# Patient Record
Sex: Male | Born: 1975 | Race: White | Hispanic: No | Marital: Married | Smoking: Never smoker
Health system: Southern US, Community
[De-identification: ages and names within clinical notes are randomized; demographics above are authoritative.]

## PROBLEM LIST (undated history)

## (undated) DIAGNOSIS — T7840XA Allergy, unspecified, initial encounter: Secondary | ICD-10-CM

## (undated) HISTORY — DX: Allergy, unspecified, initial encounter: T78.40XA

---

## 2000-11-08 HISTORY — PX: APPENDECTOMY: SHX54

## 2020-07-07 ENCOUNTER — Other Ambulatory Visit: Payer: Self-pay

## 2020-07-07 ENCOUNTER — Encounter (HOSPITAL_COMMUNITY): Payer: Self-pay | Admitting: *Deleted

## 2020-07-07 ENCOUNTER — Emergency Department (HOSPITAL_COMMUNITY)
Admission: EM | Admit: 2020-07-07 | Discharge: 2020-07-07 | Disposition: A | Discharge status: Home / Self Care | Payer: Managed Care, Other (non HMO) | Attending: Emergency Medicine | Admitting: Emergency Medicine

## 2020-07-07 ENCOUNTER — Emergency Department (HOSPITAL_COMMUNITY): Payer: Managed Care, Other (non HMO)

## 2020-07-07 DIAGNOSIS — J1282 Pneumonia due to coronavirus disease 2019: Secondary | ICD-10-CM | POA: Diagnosis not present

## 2020-07-07 DIAGNOSIS — R0789 Other chest pain: Secondary | ICD-10-CM | POA: Diagnosis present

## 2020-07-07 DIAGNOSIS — U071 COVID-19: Secondary | ICD-10-CM | POA: Diagnosis not present

## 2020-07-07 LAB — CBC
HCT: 43.2 % (ref 39.0–52.0)
Hemoglobin: 14 g/dL (ref 13.0–17.0)
MCH: 30.2 pg (ref 26.0–34.0)
MCHC: 32.4 g/dL (ref 30.0–36.0)
MCV: 93.3 fL (ref 80.0–100.0)
Platelets: 129 10*3/uL — ABNORMAL LOW (ref 150–400)
RBC: 4.63 MIL/uL (ref 4.22–5.81)
RDW: 12.8 % (ref 11.5–15.5)
WBC: 3.7 10*3/uL — ABNORMAL LOW (ref 4.0–10.5)
nRBC: 0 % (ref 0.0–0.2)

## 2020-07-07 LAB — BASIC METABOLIC PANEL
Anion gap: 8 (ref 5–15)
BUN: 16 mg/dL (ref 6–20)
CO2: 22 mmol/L (ref 22–32)
Calcium: 8.1 mg/dL — ABNORMAL LOW (ref 8.9–10.3)
Chloride: 106 mmol/L (ref 98–111)
Creatinine, Ser: 1.09 mg/dL (ref 0.61–1.24)
GFR calc Af Amer: >60 mL/min (ref >60)
GFR calc non Af Amer: >60 mL/min (ref >60)
Glucose, Bld: 144 mg/dL — ABNORMAL HIGH (ref 70–99)
Potassium: 3.6 mmol/L (ref 3.5–5.1)
Sodium: 136 mmol/L (ref 135–145)

## 2020-07-07 LAB — SARS CORONAVIRUS 2 BY RT PCR (HOSPITAL ORDER, PERFORMED IN ~~LOC~~ HOSPITAL LAB): SARS Coronavirus 2: POSITIVE — AB

## 2020-07-07 LAB — TROPONIN I (HIGH SENSITIVITY): Troponin I (High Sensitivity): 4 ng/L (ref <18)

## 2020-07-07 MED ORDER — ALBUTEROL SULFATE HFA 108 (90 BASE) MCG/ACT IN AERS
8.0000 | INHALATION_SPRAY | Freq: Once | RESPIRATORY_TRACT | Status: AC
  Administered 2020-07-07: 8 via RESPIRATORY_TRACT
  Filled 2020-07-07: qty 6.7

## 2020-07-07 MED ORDER — AEROCHAMBER PLUS FLO-VU LARGE MISC
1.0000 | Freq: Once | Status: AC
  Administered 2020-07-07: 1

## 2020-07-07 MED ORDER — ACETAMINOPHEN 325 MG PO TABS
650.0000 mg | ORAL_TABLET | Freq: Once | ORAL | Status: AC | PRN
  Administered 2020-07-07: 650 mg via ORAL
  Filled 2020-07-07: qty 2

## 2020-07-07 NOTE — Discharge Instructions (Addendum)
Thank you for allowing me to care for you today in the Emergency Department.   Your work-up today demonstrated a developing pneumonia in the left lung related to COVID-19.  Since this is a viral pneumonia, antibiotics are not indicated at this time.   Take 650 mg of Tylenol or 600 mg of ibuprofen with food every 6 hours for pain or fever.  You can alternate between these 2 medications every 3 hours if your pain returns.  For instance, you can take Tylenol at noon, followed by a dose of ibuprofen at 3, followed by second dose of Tylenol and 6.  You can use 2 to 4 puffs of albuterol with a spacer every 4-6 hours as needed for chest tightness or shortness of breath.  If you have symptoms, the criteria for ending isolation include:  10 days have passed since your symptoms began; 24 fever-free hours without the use of fever-reducing medications; Your other COVID-19 symptoms are improving  "Some of the other symptoms of COVID-19 may take quite some time to go away. For instance, loss of smell or taste can linger in some people," says Dr. Vear Clock. "You do not need to delay vaccination if you're still experiencing these more mild symptoms of COVID-19."  If you are not experiencing symptoms, there's one main criteria for ending isolation:  10 days have passed since your positive viral test  If you begin to develop symptoms during isolation, follow the "if you have symptoms" criteria above before getting vaccinated.  If you have a mild case of COVID-19 and do not require vaccination, you may receive the vaccine in as few as 14 days from when your symptoms began.  Return to the emergency department if you develop severe difficulty breathing, if your fingers in your lips turn blue, if you pass out with worsening shortness of breath, if you develop worsening shortness of breath with severe dizziness, lightheadedness, chest pain, or other new, concerning symptoms.

## 2020-07-07 NOTE — ED Triage Notes (Signed)
Pt reports having +covid home test 6 days ago. Reports having fatigue, chills, headaches but onset today of mid chest tightness. No resp distress is noted.

## 2020-07-07 NOTE — ED Notes (Signed)
Pt had syncopal episode at triage while getting chest xray. Reclined pt for short period of time and he was alert and back to baseline

## 2020-07-07 NOTE — ED Notes (Signed)
Patient verbalizes understanding of discharge instructions. Opportunity for questioning and answers were provided. Armband removed by staff, pt discharged from ED and ambulated to lobby to return home.   

## 2020-07-07 NOTE — ED Provider Notes (Signed)
[facility] Provider Note   CSN: [id number] Arrival date & time: 07/07/20  2017     History No chief complaint on file.   [facility] is a 44 y.o. male with no chronic medical conditions who presents to the emergency [facility] with a chief complaint of chest tightness.  The patient reports that he tested positive for COVID-19 2 days ago after initial symptom onset 6 days ago, including myalgias, fever, chills, and headaches.  He has been taking 400 mg of Motrin every 6 hours.  He reports that earlier tonight he developed some tightness in his chest at rest.  Tightness has been constant since onset.  No known aggravating or alleviating factors.  No history of similar.  No shortness of breath, dizziness, lightheadedness, vomiting, diarrhea, abdominal pain, loss of sense of taste or smell, leg swelling, orthopnea, or PND.    The patient reports that he briefly passed out in triage after "feeling very hot" and was found to be febrile.  Reports that he has a history of vasovagal syncope and the episode today feels similar.  Reports that he is asymptomatic at this time.  Denies hitting his head,   No treatment for his symptoms prior to arrival.  No family history of cardiovascular disease.  Patient has not been vaccinated against COVID-19.  [facility] was evaluated in Emergency [facility] on 07/07/2020 for the symptoms described in the history of present illness. He was evaluated in the context of the global COVID-19 pandemic, which necessitated consideration that the patient might be at risk for infection with the SARS-CoV-2 virus that causes COVID-19. Institutional protocols and algorithms that pertain to the evaluation of patients at risk for COVID-19 are in a state of rapid change based on information released by regulatory bodies including the CDC and federal and state organizations. These policies and algorithms were followed during the patient's care  in the ED.  The history is provided by the patient. No language interpreter was used.       History reviewed. No pertinent past medical history.  There are no problems to display for this patient.   History reviewed. No pertinent surgical history.     History reviewed. No pertinent family history.  Social History   Tobacco Use  . Smoking status: Never Smoker  Substance Use Topics  . Alcohol use: Never  . Drug use: Never    Home Medications Prior to Admission medications   Not on File    Allergies    Penicillins  Review of Systems   Review of Systems  Constitutional: Positive for chills and fever. Negative for appetite change.  HENT: Negative for congestion.   Eyes: Negative for visual disturbance.  Respiratory: Positive for chest tightness. Negative for cough, shortness of breath and wheezing.   Cardiovascular: Negative for chest pain and palpitations.  Gastrointestinal: Negative for abdominal pain, diarrhea, nausea and vomiting.  Genitourinary: Negative for dysuria.  Musculoskeletal: Positive for myalgias. Negative for back pain and neck stiffness.  Skin: Negative for rash.  Allergic/Immunologic: Negative for immunocompromised state.  Neurological: Positive for syncope and headaches. Negative for dizziness, weakness, light-headedness and numbness.  Psychiatric/Behavioral: Negative for confusion.    Physical Exam Updated Vital Signs BP 131/73 (BP Location: Right Arm)   Pulse 68   Temp 97.8 F (36.6 C) (Oral)   Resp 18   Ht 5\' 11"  (1.803 m)   Wt 73.9 kg   SpO2 99%   BMI 22.73 kg/m   Physical Exam  Vitals and nursing note reviewed.  Constitutional:      Appearance: Normal appearance. He is well-developed. He is not toxic-appearing.     Comments: Well appearing, healthy male.   HENT:     Head: Normocephalic.     Nose: Nose normal.     Mouth/Throat:     Mouth: Mucous membranes are moist.  Eyes:     Extraocular Movements: Extraocular movements  intact.     Conjunctiva/sclera: Conjunctivae normal.     Pupils: Pupils are equal, round, and reactive to light.  Cardiovascular:     Rate and Rhythm: Normal rate and regular rhythm.     Heart sounds: No murmur heard.      Comments: 2+ peripheral pulses bilaterally Pulmonary:     Effort: Pulmonary effort is normal. No respiratory distress.     Breath sounds: No stridor. No wheezing, rhonchi or rales.     Comments: Lungs are clear to auscultation bilaterally.  No increased work of breathing.  Able to speak in complete, fluent sentences without dyspnea. Chest:     Chest wall: No tenderness.  Abdominal:     General: There is no distension.     Palpations: Abdomen is soft. There is no mass.     Tenderness: There is no abdominal tenderness. There is no right CVA tenderness, left CVA tenderness, guarding or rebound.     Hernia: No hernia is present.     Comments: Abdomen is soft, nontender, nondistended.  Musculoskeletal:        General: No tenderness.     Cervical back: Neck supple.     Right lower leg: No edema.     Left lower leg: No edema.  Skin:    General: Skin is warm and dry.     Capillary Refill: Capillary refill takes less than 2 seconds.  Neurological:     General: No focal deficit present.     Mental Status: He is alert.  Psychiatric:        Behavior: Behavior normal.     ED Results / Procedures / Treatments   Labs (all labs ordered are listed, but only abnormal results are displayed) Labs Reviewed  SARS CORONAVIRUS 2 BY RT PCR (HOSPITAL ORDER, PERFORMED IN [facility]) - Abnormal; Notable for the following components:      Result Value   SARS Coronavirus 2 POSITIVE (*)    All other components within normal limits  BASIC METABOLIC PANEL - Abnormal; Notable for the following components:   Glucose, Bld 144 (*)    Calcium 8.1 (*)    All other components within normal limits  CBC - Abnormal; Notable for the following components:   WBC 3.7 (*)     Platelets 129 (*)    All other components within normal limits  TROPONIN I (HIGH SENSITIVITY)  TROPONIN I (HIGH SENSITIVITY)    EKG None  Radiology DG Chest Portable 1 View  Result Date: 07/07/2020 CLINICAL DATA:  Chest pain COVID positive EXAM: PORTABLE CHEST 1 VIEW COMPARISON:  None. FINDINGS: The heart size and mediastinal contours are within normal limits. Hazy airspace opacity seen at the left lung base. The visualized skeletal structures are unremarkable. IMPRESSION: Hazy airspace opacity at the left lung base which could be due to early infectious etiology. Electronically Signed   By: Jonna Clark M.D.   On: 07/07/2020 21:44    Procedures Procedures (including critical care time)  Medications Ordered in ED Medications  acetaminophen (TYLENOL) tablet 650 mg (650 mg  Oral Given 07/07/20 2041)  AeroChamber Plus Flo-Vu Large MISC 1 each (1 each Other Given 07/07/20 2305)  albuterol (VENTOLIN HFA) 108 (90 Base) MCG/ACT inhaler 8 puff (8 puffs Inhalation Given 07/07/20 2301)    ED Course  I have reviewed the triage vital signs and the nursing notes.  Pertinent labs & imaging results that were available during my care of the patient were reviewed by me and considered in my medical decision making (see chart for details).    MDM Rules/Calculators/A&P                          44 year old male with no chronic medical conditions who tested positive for COVID-19 2 days ago after symptom onset 6 days ago.  He has not been vaccinated against COVID-19.  He is here after he developed mild chest tightness tonight.  No shortness of breath.  Febrile on arrival with an associated vasovagal syncope episode in triage.  He has a history of the same he reports that the episode tonight felt similar to previous episodes.  He is otherwise had no dizziness or lightheadedness.  He has since defervesced.  Chest x-ray with developing atypical pneumonia in the left lung base on my read. EKG with sinus rhythm.   He has leukopenia and thrombocytopenia consistent with COVID-19 infection.  No electrolyte derangements.  Labs are otherwise reassuring.  Patient was ambulated for 2 minutes with no hypoxia and was asymptomatic.  He was given albuterol inhaler with spacer.  Home symptom management discussed.  All questions answered.  He is hemodynamically stable to no acute distress.  Safe for discharge home with outpatient follow-up as indicated.  Final Clinical Impression(s) / ED Diagnoses Final diagnoses:  Pneumonia due to COVID-19 virus    Rx / DC Orders ED Discharge Orders    None       Barkley Boards, PA-C 07/07/20 [place]da [place], MD 07/08/20 [phone]

## 2020-07-07 NOTE — ED Notes (Signed)
Pt ambulated in the room with no assistance and steady gait, no complaint of dizziness or SOB. O2 remained from 98-100% while ambulating.

## 2020-07-08 ENCOUNTER — Telehealth: Payer: Self-pay | Admitting: Oncology

## 2020-07-09 ENCOUNTER — Telehealth: Payer: Self-pay

## 2020-07-09 ENCOUNTER — Ambulatory Visit (INDEPENDENT_AMBULATORY_CARE_PROVIDER_SITE_OTHER): Payer: Managed Care, Other (non HMO) | Admitting: Nurse Practitioner

## 2020-07-09 VITALS — BP 140/84 | HR 82 | Temp 98.0°F | Ht 70.0 in | Wt 164.0 lb

## 2020-07-09 DIAGNOSIS — U071 COVID-19: Secondary | ICD-10-CM

## 2020-07-09 DIAGNOSIS — R05 Cough: Secondary | ICD-10-CM

## 2020-07-09 DIAGNOSIS — R059 Cough, unspecified: Secondary | ICD-10-CM | POA: Insufficient documentation

## 2020-07-09 HISTORY — DX: COVID-19: U07.1

## 2020-07-09 MED ORDER — AZITHROMYCIN 250 MG PO TABS: ORAL_TABLET | ORAL | 0 refills | Status: DC

## 2020-07-09 MED ORDER — PREDNISONE 10 MG PO TABS: ORAL_TABLET | ORAL | 0 refills | Status: DC

## 2020-07-09 MED ORDER — BENZONATATE 100 MG PO CAPS: 100.0000 mg | ORAL_CAPSULE | Freq: Two times a day (BID) | ORAL | 0 refills | Status: DC | PRN

## 2020-07-09 NOTE — Telephone Encounter (Signed)
Lvm to schedule on this coming Tuesday for a virtual.

## 2020-07-09 NOTE — Telephone Encounter (Signed)
Pt was diagnosed with Covid and pneumonia on the 30th. He is requesting if there is anyway at all he can have a virtual visit for possibly a steriod or something to help with his cough. He has a new patient appt on the 21st.

## 2020-07-09 NOTE — Telephone Encounter (Signed)
Please see message and advise 

## 2020-07-09 NOTE — Telephone Encounter (Signed)
I have no openings until Tuesday. You can either schedule him with me VIRTUALLY on that date or have him see urgent care in the interim.  You could also have him sign up for MyChart and see if he can do a MyChart e-visit for further guidance.

## 2020-07-09 NOTE — Patient Instructions (Addendum)
Covid 19 Cough:  Will order azithromycin  Will order prednisone  Will order tessalon perles for cough  Stay well hydrated  Stay active  Deep breathing exercises  May start vitamin C 2,000 mg daily, vitamin D3 2,000 IU daily, Zinc 220 mg daily, and Quercetin 500 mg twice daily  May take tylenol or fever or pain  May take mucinex DM twice daily    Follow up:  Follow up in 4 weeks or sooner if needed

## 2020-07-09 NOTE — Assessment & Plan Note (Signed)
Cough:  Will order azithromycin  Will order prednisone  Will order tessalon perles for cough  Stay well hydrated  Stay active  Deep breathing exercises  May start vitamin C 2,000 mg daily, vitamin D3 2,000 IU daily, Zinc 220 mg daily, and Quercetin 500 mg twice daily  May take tylenol or fever or pain  May take mucinex DM twice daily    Follow up:  Follow up in 4 weeks or sooner if needed

## 2020-07-09 NOTE — Progress Notes (Signed)
@Patient  ID: , male    DOB: [birth year], 44 y.o.   MRN: [id number]  Chief Complaint  Patient presents with  . COVID Positive    Positive 8/30. Sx: fatigue, cough, fever    Referring provider: No ref. provider found   44 year old male with no significant health history.  Diagnosed with Covid on 07/07/2020.  HPI  Patient presents today for post COVID care clinic visit.  He was seen in the ED on 07/07/2020 tested positive for Covid.  Patient states that his symptoms started on 07/01/2020.  Chest x-ray showed Covid pneumonia.  He was prescribed albuterol inhaler.  Patient states that he continues to have ongoing cough and chest congestion.  He is eating and drinking well.  He is trying to stay active. Denies f/c/s, n/v/d, hemoptysis, PND, chest pain or edema.      Allergies  Allergen Reactions  . Penicillins      There is no immunization history on file for this patient.  History reviewed. No pertinent past medical history.  Tobacco History: Social History   Tobacco Use  Smoking Status Never Smoker  Smokeless Tobacco Never Used   Counseling given: Yes   Outpatient Encounter Medications as of 07/09/2020  Medication Sig  . azithromycin (ZITHROMAX) 250 MG tablet Take 2 tablets (500 mg) on day 1, then take 1 tablet (250 mg) on days 2-5  . benzonatate (TESSALON) 100 MG capsule Take 1 capsule (100 mg total) by mouth 2 (two) times daily as needed for cough.  . predniSONE (DELTASONE) 10 MG tablet Take 4 tabs for 2 days, then 3 tabs for 2 days, then 2 tabs for 2 days, then 1 tab for 2 days, then stop   No facility-administered encounter medications on file as of 07/09/2020.     Review of Systems  Review of Systems  Constitutional: Positive for chills, fatigue and fever.  HENT: Negative.   Respiratory: Positive for cough and shortness of breath.   Cardiovascular: Negative.   Gastrointestinal: Negative.   Allergic/Immunologic: Negative.   Neurological: Negative.    Psychiatric/Behavioral: Negative.        Physical Exam  BP 140/84 (BP Location: Left Arm)   Pulse 82   Temp 98 F (36.7 C)   Ht 5\' 10"  (1.778 m)   Wt 164 lb (74.4 kg)   SpO2 96%   BMI 23.53 kg/m   Wt Readings from Last 5 Encounters:  07/09/20 164 lb (74.4 kg)  07/07/20 163 lb (73.9 kg)     Physical Exam Vitals and nursing note reviewed.  Constitutional:      General: He is not in acute distress.    Appearance: He is well-developed.  Cardiovascular:     Rate and Rhythm: Normal rate and regular rhythm.  Pulmonary:     Effort: Pulmonary effort is normal.     Breath sounds: Normal breath sounds.  Musculoskeletal:     Right lower leg: No edema.     Left lower leg: No edema.  Skin:    General: Skin is warm and dry.  Neurological:     Mental Status: He is alert and oriented to person, place, and time.       Imaging: DG Chest Portable 1 View  Result Date: 07/07/2020 CLINICAL DATA:  Chest pain COVID positive EXAM: PORTABLE CHEST 1 VIEW COMPARISON:  None. FINDINGS: The heart size and mediastinal contours are within normal limits. Hazy airspace opacity seen at the left lung base. The visualized skeletal structures are unremarkable. IMPRESSION:  Hazy airspace opacity at the left lung base which could be due to early infectious etiology. Electronically Signed   By: Jonna Clark M.D.   On: 07/07/2020 21:44     Assessment & Plan:   COVID-19 Cough:  Will order azithromycin  Will order prednisone  Will order tessalon perles for cough  Stay well hydrated  Stay active  Deep breathing exercises  May start vitamin C 2,000 mg daily, vitamin D3 2,000 IU daily, Zinc 220 mg daily, and Quercetin 500 mg twice daily  May take tylenol or fever or pain  May take mucinex DM twice daily    Follow up:  Follow up in 4 weeks or sooner if needed      [name], NP 07/09/2020

## 2020-07-11 ENCOUNTER — Other Ambulatory Visit: Payer: Self-pay | Admitting: Nurse Practitioner

## 2020-07-11 ENCOUNTER — Ambulatory Visit (HOSPITAL_COMMUNITY)
Admission: RE | Admit: 2020-07-11 | Discharge: 2020-07-11 | Disposition: A | Discharge status: Home / Self Care | Payer: Managed Care, Other (non HMO) | Source: Ambulatory Visit | Attending: Pulmonary Disease | Admitting: Pulmonary Disease

## 2020-07-11 ENCOUNTER — Emergency Department (HOSPITAL_COMMUNITY)
Admission: EM | Admit: 2020-07-11 | Discharge: 2020-07-12 | Disposition: A | Discharge status: Home / Self Care | Payer: Managed Care, Other (non HMO) | Attending: Emergency Medicine | Admitting: Emergency Medicine

## 2020-07-11 ENCOUNTER — Other Ambulatory Visit: Payer: Self-pay

## 2020-07-11 ENCOUNTER — Emergency Department (HOSPITAL_COMMUNITY): Payer: Managed Care, Other (non HMO)

## 2020-07-11 DIAGNOSIS — U071 COVID-19: Secondary | ICD-10-CM

## 2020-07-11 DIAGNOSIS — Z609 Problem related to social environment, unspecified: Secondary | ICD-10-CM

## 2020-07-11 DIAGNOSIS — N50812 Left testicular pain: Secondary | ICD-10-CM | POA: Diagnosis not present

## 2020-07-11 DIAGNOSIS — R509 Fever, unspecified: Secondary | ICD-10-CM | POA: Insufficient documentation

## 2020-07-11 DIAGNOSIS — R1032 Left lower quadrant pain: Secondary | ICD-10-CM | POA: Diagnosis not present

## 2020-07-11 LAB — COMPREHENSIVE METABOLIC PANEL
ALT: 38 U/L (ref 0–44)
AST: 49 U/L — ABNORMAL HIGH (ref 15–41)
Albumin: 3.4 g/dL — ABNORMAL LOW (ref 3.5–5.0)
Alkaline Phosphatase: 43 U/L (ref 38–126)
Anion gap: 12 (ref 5–15)
BUN: 16 mg/dL (ref 6–20)
CO2: 24 mmol/L (ref 22–32)
Calcium: 8.4 mg/dL — ABNORMAL LOW (ref 8.9–10.3)
Chloride: 101 mmol/L (ref 98–111)
Creatinine, Ser: 1.29 mg/dL — ABNORMAL HIGH (ref 0.61–1.24)
GFR calc Af Amer: >60 mL/min (ref >60)
GFR calc non Af Amer: >60 mL/min (ref >60)
Glucose, Bld: 126 mg/dL — ABNORMAL HIGH (ref 70–99)
Potassium: 3.8 mmol/L (ref 3.5–5.1)
Sodium: 137 mmol/L (ref 135–145)
Total Bilirubin: 0.6 mg/dL (ref 0.3–1.2)
Total Protein: 6.4 g/dL — ABNORMAL LOW (ref 6.5–8.1)

## 2020-07-11 LAB — LACTIC ACID, PLASMA
Lactic Acid, Venous: 1.8 mmol/L (ref 0.5–1.9)
Lactic Acid, Venous: 1.9 mmol/L (ref 0.5–1.9)

## 2020-07-11 LAB — URINALYSIS, ROUTINE W REFLEX MICROSCOPIC
Bacteria, UA: NONE SEEN
Bilirubin Urine: NEGATIVE
Glucose, UA: NEGATIVE mg/dL
Ketones, ur: NEGATIVE mg/dL
Leukocytes,Ua: NEGATIVE
Nitrite: NEGATIVE
Protein, ur: 100 mg/dL — AB
Specific Gravity, Urine: 1.024 (ref 1.005–1.030)
pH: 6 (ref 5.0–8.0)

## 2020-07-11 LAB — CBC WITH DIFFERENTIAL/PLATELET
Abs Immature Granulocytes: 0.03 10*3/uL (ref 0.00–0.07)
Basophils Absolute: 0 10*3/uL (ref 0.0–0.1)
Basophils Relative: 0 %
Eosinophils Absolute: 0 10*3/uL (ref 0.0–0.5)
Eosinophils Relative: 0 %
HCT: 45.1 % (ref 39.0–52.0)
Hemoglobin: 14.8 g/dL (ref 13.0–17.0)
Immature Granulocytes: 0 %
Lymphocytes Relative: 9 %
Lymphs Abs: 0.7 10*3/uL (ref 0.7–4.0)
MCH: 30.6 pg (ref 26.0–34.0)
MCHC: 32.8 g/dL (ref 30.0–36.0)
MCV: 93.2 fL (ref 80.0–100.0)
Monocytes Absolute: 0.3 10*3/uL (ref 0.1–1.0)
Monocytes Relative: 4 %
Neutro Abs: 6.9 10*3/uL (ref 1.7–7.7)
Neutrophils Relative %: 87 %
Platelets: 172 10*3/uL (ref 150–400)
RBC: 4.84 MIL/uL (ref 4.22–5.81)
RDW: 13.3 % (ref 11.5–15.5)
WBC: 8 10*3/uL (ref 4.0–10.5)
nRBC: 0 % (ref 0.0–0.2)

## 2020-07-11 MED ORDER — FAMOTIDINE IN NACL 20-0.9 MG/50ML-% IV SOLN: 20.0000 mg | Freq: Once | INTRAVENOUS | Status: DC | PRN

## 2020-07-11 MED ORDER — METHYLPREDNISOLONE SODIUM SUCC 125 MG IJ SOLR: 125.0000 mg | Freq: Once | INTRAMUSCULAR | Status: DC | PRN

## 2020-07-11 MED ORDER — EPINEPHRINE 0.3 MG/0.3ML IJ SOAJ: 0.3000 mg | Freq: Once | INTRAMUSCULAR | Status: DC | PRN

## 2020-07-11 MED ORDER — SODIUM CHLORIDE 0.9 % IV SOLN: INTRAVENOUS | Status: DC | PRN

## 2020-07-11 MED ORDER — ALBUTEROL SULFATE HFA 108 (90 BASE) MCG/ACT IN AERS: 2.0000 | INHALATION_SPRAY | Freq: Once | RESPIRATORY_TRACT | Status: DC | PRN

## 2020-07-11 MED ORDER — DIPHENHYDRAMINE HCL 50 MG/ML IJ SOLN: 50.0000 mg | Freq: Once | INTRAMUSCULAR | Status: DC | PRN

## 2020-07-11 MED ORDER — SODIUM CHLORIDE 0.9 % IV SOLN
1200.0000 mg | Freq: Once | INTRAVENOUS | Status: AC
  Administered 2020-07-11: 1200 mg via INTRAVENOUS
  Filled 2020-07-11: qty 10

## 2020-07-11 NOTE — ED Triage Notes (Signed)
Pt had covid antibody infusion this morning. Sts was feeling better but after infusion this morning can't keep his fever down. Also reports groin pain. Denies shob. Endorses lingering cough and fever.

## 2020-07-11 NOTE — Progress Notes (Signed)
  Diagnosis: COVID-19  Physician: Dr. Patrick Wright  Procedure: Covid Infusion Clinic Med: casirivimab\imdevimab infusion - Provided patient with casirivimab\imdevimab fact sheet for patients, parents and caregivers prior to infusion.  Complications: No immediate complications noted.  Discharge: Discharged home   Ally Yow 07/11/2020   

## 2020-07-11 NOTE — Progress Notes (Signed)
S. Received call from patient NP from COVID clinic to evaluate patient upon arrival due to continued symptoms after COVID 19 infection.  [place] notes that he is day 10 of symptom onset, and tolerated his regeneron treatment well.  He has no shortness of breath, chest pain, palpitations.  He has on occasional intermittent cough, worse  At night and had a fever this morning.  He defervesced with tylenol.  He notes that he is feeling better since his temp has come down.    O:  BP 124/78 (BP Location: Left Arm)   Pulse 85   Temp 98.8 F (37.1 C) (Oral)   Resp 18   SpO2 93%  O: Patient appears well, in no apparent distress Lungs are clear to ausculatation bilaterally without wheezing, tachypnea, or any respiratory distress. Cardiac: RRR, no murmurs, rubs, gallops Mood and behavior are normal. Skin without rash or lesion.    A.  COVID 19 viral infection  (a) persistent fevers  P: Rudie is a 44 year old with COVID infection and has had some more fevers.  His vitals are stable, and he is stable on exam. I encouraged him to continue with fluid intake, taking deep breaths to allow his lungs to fully expand, and to call PCP or covid clinic where he is established for any worsening symptom, red flags reviewed.    I provided a letter for him for his work stating he cannot receive COVID 19 vaccine until 90 days after his treatment.    Lillard Anes, NP

## 2020-07-11 NOTE — ED Notes (Signed)
Pt states he is leaving °

## 2020-07-11 NOTE — Progress Notes (Signed)
I connected by phone with [facility] on 07/11/2020 at 9:07 AM to discuss the potential use of a new treatment for mild to moderate COVID-19 viral infection in non-hospitalized patients.  This patient is a 44 y.o. male that meets the FDA criteria for Emergency Use Authorization of COVID monoclonal antibody casirivimab/imdevimab.  Has a (+) direct SARS-CoV-2 viral test result  Has mild or moderate COVID-19   Is NOT hospitalized due to COVID-19  Is within 10 days of symptom onset  Has at least one of the high risk factor(s) for progression to severe COVID-19 and/or hospitalization as defined in EUA.  Specific high risk criteria : Other high risk medical condition per CDC:  high social risk situation - unvaccinated    I have spoken and communicated the following to the patient or parent/caregiver regarding COVID monoclonal antibody treatment:  1. FDA has authorized the emergency use for the treatment of mild to moderate COVID-19 in adults and pediatric patients with positive results of direct SARS-CoV-2 viral testing who are 57 years of age and older weighing at least 40 kg, and who are at high risk for progressing to severe COVID-19 and/or hospitalization.  2. The significant known and potential risks and benefits of COVID monoclonal antibody, and the extent to which such potential risks and benefits are unknown.  3. Information on available alternative treatments and the risks and benefits of those alternatives, including clinical trials.  4. Patients treated with COVID monoclonal antibody should continue to self-isolate and use infection control measures (e.g., wear mask, isolate, social distance, avoid sharing personal items, clean and disinfect "high touch" surfaces, and frequent handwashing) according to CDC guidelines.   5. The patient or parent/caregiver has the option to accept or refuse COVID monoclonal antibody treatment.  After reviewing this information with the patient, The  patient agreed to proceed with receiving casirivimab\imdevimab infusion and will be provided a copy of the Fact sheet prior to receiving the infusion. [name] 07/11/2020 9:07 AM

## 2020-07-11 NOTE — Discharge Instructions (Signed)

## 2020-07-12 ENCOUNTER — Emergency Department (HOSPITAL_COMMUNITY)
Admission: EM | Admit: 2020-07-12 | Discharge: 2020-07-12 | Disposition: A | Discharge status: Home / Self Care | Payer: Managed Care, Other (non HMO) | Source: Home / Self Care | Attending: Emergency Medicine | Admitting: Emergency Medicine

## 2020-07-12 ENCOUNTER — Other Ambulatory Visit: Payer: Self-pay

## 2020-07-12 ENCOUNTER — Encounter (HOSPITAL_COMMUNITY): Payer: Self-pay

## 2020-07-12 ENCOUNTER — Emergency Department (HOSPITAL_COMMUNITY): Payer: Managed Care, Other (non HMO)

## 2020-07-12 DIAGNOSIS — U071 COVID-19: Secondary | ICD-10-CM | POA: Insufficient documentation

## 2020-07-12 DIAGNOSIS — N5089 Other specified disorders of the male genital organs: Secondary | ICD-10-CM

## 2020-07-12 DIAGNOSIS — R1032 Left lower quadrant pain: Secondary | ICD-10-CM

## 2020-07-12 DIAGNOSIS — N50812 Left testicular pain: Secondary | ICD-10-CM | POA: Insufficient documentation

## 2020-07-12 LAB — CBC
HCT: 45.1 % (ref 39.0–52.0)
Hemoglobin: 14.7 g/dL (ref 13.0–17.0)
MCH: 29.8 pg (ref 26.0–34.0)
MCHC: 32.6 g/dL (ref 30.0–36.0)
MCV: 91.5 fL (ref 80.0–100.0)
Platelets: 187 10*3/uL (ref 150–400)
RBC: 4.93 MIL/uL (ref 4.22–5.81)
RDW: 13.2 % (ref 11.5–15.5)
WBC: 7.4 10*3/uL (ref 4.0–10.5)
nRBC: 0 % (ref 0.0–0.2)

## 2020-07-12 LAB — COMPREHENSIVE METABOLIC PANEL
ALT: 41 U/L (ref 0–44)
AST: 46 U/L — ABNORMAL HIGH (ref 15–41)
Albumin: 3.5 g/dL (ref 3.5–5.0)
Alkaline Phosphatase: 43 U/L (ref 38–126)
Anion gap: 10 (ref 5–15)
BUN: 13 mg/dL (ref 6–20)
CO2: 25 mmol/L (ref 22–32)
Calcium: 8.8 mg/dL — ABNORMAL LOW (ref 8.9–10.3)
Chloride: 105 mmol/L (ref 98–111)
Creatinine, Ser: 1.12 mg/dL (ref 0.61–1.24)
GFR calc Af Amer: >60 mL/min (ref >60)
GFR calc non Af Amer: >60 mL/min (ref >60)
Glucose, Bld: 110 mg/dL — ABNORMAL HIGH (ref 70–99)
Potassium: 3.8 mmol/L (ref 3.5–5.1)
Sodium: 140 mmol/L (ref 135–145)
Total Bilirubin: 0.8 mg/dL (ref 0.3–1.2)
Total Protein: 6.6 g/dL (ref 6.5–8.1)

## 2020-07-12 LAB — URINALYSIS, ROUTINE W REFLEX MICROSCOPIC
Bacteria, UA: NONE SEEN
Bilirubin Urine: NEGATIVE
Glucose, UA: NEGATIVE mg/dL
Ketones, ur: NEGATIVE mg/dL
Leukocytes,Ua: NEGATIVE
Nitrite: NEGATIVE
Protein, ur: 30 mg/dL — AB
Specific Gravity, Urine: 1.013 (ref 1.005–1.030)
pH: 7 (ref 5.0–8.0)

## 2020-07-12 LAB — LIPASE, BLOOD: Lipase: 52 U/L — ABNORMAL HIGH (ref 11–51)

## 2020-07-12 NOTE — ED Provider Notes (Signed)
[facility] EMERGENCY DEPARTMENT Provider Note   CSN: [id number] Arrival date & time: 07/12/20  1120     History No chief complaint on file.   Jonathan Elliott is a 44 y.o. male with COVID-19 presents for evaluation of ongoing but worsening left groin pains.  He reports he initially developed symptoms of COVID-19 11 days ago including fever, myalgias, chills, headaches.  He has been alternating between ibuprofen and Tylenol with good control of fever which has been up to 102 F at home.  Yesterday received his first monoclonal antibody infusion and noted improvement in fever but did note fever at night.  States that overall his symptoms have been improving but he does have a persistent cough.  Denies chest pain, shortness of breath, abdominal pain, nausea, or vomiting.  He has been checking his oxygen saturations at home with his pulse oximeter and notes that it is typically in the mid 90s with the lowest it has dropped to is at 91% briefly.   He has noted worsening left groin pain radiating to the left testicle for the last week or so.  He has a history of similar groin pain for which his PCP has evaluated him.  He has an appointment scheduled to establish care with urology later this month.  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  He denies urinary symptoms, diarrhea, constipation, hematuria, melena, hematochezia, or erectile dysfunction. He is concerned about his pain worsening in the setting of COVID-19 infection.  He experiences the pain daily, persistently.  He describes it as a dull pain but sometimes it worsens.  No aggravating or alleviating factors identified. He does weightlift at the gym and thinks he could have developed a hernia.   The history is provided by the patient.       History reviewed. No pertinent past medical history.  Patient Active  Problem List   Diagnosis Date Noted  . COVID-19 07/09/2020  . Cough 07/09/2020    History reviewed. No pertinent surgical history.     Family History  Problem Relation Age of Onset  . Heart disease Father     Social History   Tobacco Use  . Smoking status: Never Smoker  . Smokeless tobacco: Never Used  Substance Use Topics  . Alcohol use: Never  . Drug use: Never    Home Medications Prior to Admission medications   Medication Sig Start Date End Date Taking? Authorizing Provider  azithromycin (ZITHROMAX) 250 MG tablet Take 2 tablets (500 mg) on day 1, then take 1 tablet (250 mg) on days 2-5 07/09/20   [name], NP  benzonatate (TESSALON) 100 MG capsule Take 1 capsule (100 mg total) by mouth 2 (two) times daily as needed for cough. 07/09/20   [name], NP  predniSONE (DELTASONE) 10 MG tablet Take 4 tabs for 2 days, then 3 tabs for 2 days, then 2 tabs for 2 days, then 1 tab for 2 days, then stop 07/09/20   [name], NP    Allergies    Penicillins  Review of Systems   Review of Systems  Constitutional: Positive for fever.  Respiratory: Positive for cough. Negative for chest tightness and shortness of breath.   Cardiovascular: Negative for chest pain and leg swelling.  Gastrointestinal: Negative for abdominal pain, constipation, diarrhea, nausea and vomiting.  Genitourinary: Positive for testicular pain. Negative for dysuria, frequency,  hematuria, penile pain, penile swelling and urgency.  All other systems reviewed and are negative.   Physical Exam Updated Vital Signs BP 128/80 (BP Location: Right Arm)   Pulse 70   Temp 98.6 F (37 C) (Oral)   Resp 10   Ht 5\' 10"  (1.778 m)   Wt 74.8 kg   SpO2 96%   BMI 23.68 kg/m   Physical Exam Vitals and nursing note reviewed. Exam conducted with a chaperone present.  Constitutional:      General: He is not in acute distress.    Appearance: He is well-developed.  HENT:     Head: Normocephalic and  atraumatic.  Eyes:     General:        Right eye: No discharge.        Left eye: No discharge.     Conjunctiva/sclera: Conjunctivae normal.  Neck:     Vascular: No JVD.     Trachea: No tracheal deviation.  Cardiovascular:     Rate and Rhythm: Normal rate and regular rhythm.     Comments: 2+ radial and DP/PT pulses bilaterally, Homans sign absent bilaterally, no lower extremity edema, no palpable cords, compartments are soft  Pulmonary:     Effort: Pulmonary effort is normal.     Comments: Speaking in full sentences without difficulty.  SPO2 saturations 96 to 98% on room air.  Bibasilar crackles noted, left worse than right Abdominal:     General: There is no distension.     Hernia: There is no hernia in the left inguinal area or right inguinal area.  Genitourinary:    Penis: Normal.      Testes:        Left: Tenderness present. Swelling not present.     Epididymis:     Right: Normal.     Left: Normal.  Lymphadenopathy:     Lower Body: No right inguinal adenopathy. No left inguinal adenopathy.  Skin:    General: Skin is warm.     Findings: No erythema.  Neurological:     Mental Status: He is alert.  Psychiatric:        Behavior: Behavior normal.     ED Results / Procedures / Treatments   Labs (all labs ordered are listed, but only abnormal results are displayed) Labs Reviewed  LIPASE, BLOOD - Abnormal; Notable for the following components:      Result Value   Lipase 52 (*)    All other components within normal limits  COMPREHENSIVE METABOLIC PANEL - Abnormal; Notable for the following components:   Glucose, Bld 110 (*)    Calcium 8.8 (*)    AST 46 (*)    All other components within normal limits  CBC  URINALYSIS, ROUTINE W REFLEX MICROSCOPIC    EKG EKG Interpretation  Date/Time:  Saturday July 12 2020 11:38:10 EDT Ventricular Rate:  85 PR Interval:  150 QRS Duration: 90 QT Interval:  340 QTC Calculation: 404 R Axis:   80 Text Interpretation: Normal  sinus rhythm Minimal voltage criteria for LVH, may be normal variant ( Sokolow-Lyon ) Nonspecific ST abnormality Abnormal ECG since last tracing no significant change Confirmed by 05-11-1979 (Eber Hong) on 07/12/2020 2:07:58 PM   Radiology DG Chest Portable 1 View  Result Date: 07/11/2020 CLINICAL DATA:  Fever.  COVID positive. EXAM: PORTABLE CHEST 1 VIEW COMPARISON:  07/07/2020 FINDINGS: Progressive left basilar opacity now with air bronchograms. Development of hazy opacity in the right mid lower lung zone. No pneumothorax. Stable heart size  and mediastinal contours. No significant pleural effusion. No acute osseous abnormalities are seen. IMPRESSION: Progressive COVID pneumonia over the last 4 days. Electronically Signed   By: Narda Rutherford M.D.   On: 07/11/2020 18:16    Procedures Procedures (including critical care time)  Medications Ordered in ED Medications - No data to display  ED Course  I have reviewed the triage vital signs and the nursing notes.  Pertinent labs & imaging results that were available during my care of the patient were reviewed by me and considered in my medical decision making (see chart for details).    MDM Rules/Calculators/A&P                          Jonathan Elliott was evaluated in Emergency Department on 07/12/2020 for the symptoms described in the history of present illness. He was evaluated in the context of the global COVID-19 pandemic, which necessitated consideration that the patient might be at risk for infection with the SARS-CoV-2 virus that causes COVID-19. Institutional protocols and algorithms that pertain to the evaluation of patients at risk for COVID-19 are in a state of rapid change based on information released by regulatory bodies including the CDC and federal and state organizations. These policies and algorithms were followed during the patient's care in the ED.  Patient presenting for reevaluation of Covid symptoms as well as complaint of left  groin pain that has been ongoing for several months but worsening for a week or so.  He is afebrile in the ED, vital signs are stable.  He is nontoxic in appearance.  He exhibits no evidence of respiratory distress and is speaking in full sentences.  SPO2 saturations are within normal limits in the ED and have generally been within normal limits when he has checked his O2 saturations at home.  Lungs suggest persistent Covid pneumonia (and chest x-ray performed yesterday while in the waiting room shows progressive pneumonia) however his shortness of breath and cough have improved.  He reports that he has been taking ibuprofen and Tylenol less as he has not needed as often and his fever is improving.  From a Covid standpoint I feel reassured.  Doubt PE.  I did encourage him to continue following up at the post Covid care center at [facility].  We discussed continued quarantining until he meets criteria per CDC guidelines to stop quarantining.  With regards to his left groin pain, he had an abnormal ultrasound in 2019.  His PCP recommended follow-up with urology but he has not been able to do so until now and has an appointment to establish care with them later this month.  Physical examination overall is reassuring and is not concerning for Fournier's gangrene, epididymitis, orchitis, prostatitis, incarcerated/strangulated hernia, or testicular torsion.  His abdomen is soft and nontender and I doubt acute surgical abdominal pathology.  Lab work reviewed and interpreted by myself shows no leukocytosis, no anemia, no renal insufficiency, no metabolic derangements.  AST and lipase are very mildly elevated but he has no upper abdominal tenderness to suggest acute hepatobiliary dysfunction or pancreatitis.  Doubt cholecystitis or choledocholithiasis at this time given reassuring physical examination.  Suspect that these labs are elevated in the setting of COVID-19 infection.  We will obtain scrotal ultrasound to rule out  mass or concerning abnormality.  If this is reassuring I suspect that he will likely be stable for discharge home with close outpatient urology follow-up.  4:00PM Care signed out to  PA Murphy to follow up on results of ultrasound. If reassuring, plan for discharge home with continued quarantining from a COVID standpoint and follow up with urology as scheduled.   Final Clinical Impression(s) / ED Diagnoses Final diagnoses:  COVID-19 virus infection  Left groin pain    Rx / DC Orders ED Discharge Orders    None       Bennye AlmFawze, Loreal Schuessler A, PA-C 07/13/20 1058    Eber HongMiller, Brian, MD 07/13/20 1218

## 2020-07-12 NOTE — Discharge Instructions (Addendum)
Follow-up with urology as scheduled, you can call on Tuesday to ask for an earlier appointment or add to the cancellation list.   Follow-up at the post Covid care clinic at [facility].  You do have persistent pneumonia on x-ray but overall your symptoms seem to be improving.  Continue to monitor your oxygen levels using the pulse oximeter.  Anything below 92% is abnormal and if you are experiencing worsening shortness of breath and abnormal oxygen saturations and you should seek immediate evaluation.  Plenty of fluids and get rest.  You can take ibuprofen and Tylenol as needed for aches pains and fevers.  Take ibuprofen with food to avoid upset stomach issues.  Continue quarantining at home until you have had improving symptoms and have been fever free without the use of ibuprofen or Tylenol for 24 hours.

## 2020-07-12 NOTE — ED Provider Notes (Signed)
44yo male, known COVID, improving. Here for groin pain (left side). Pending UA, [place]. Plan to follow up with urology later this month.  Physical Exam  BP 128/80 (BP Location: Right Arm)   Pulse 70   Temp 98.6 F (37 C) (Oral)   Resp 10   Ht 5\' 10"  (1.778 m)   Wt 74.8 kg   SpO2 96%   BMI 23.68 kg/m   Physical Exam Alert, non toxic, no distress ED Course/Procedures     Procedures  MDM  with concern for mass at left epididymis.  Results were printed off and given to the patient, discussed concern for mass, possibly related to cancer.  Plan is for patient to follow-up with urology, he is scheduled to follow-up again to the month, will call on Tuesday to inquire about an earlier appointment.  No further questions at this time.       Thursday, PA-C 07/12/20 1806    09/11/20, MD 07/12/20 [phone]

## 2020-07-12 NOTE — ED Triage Notes (Signed)
Patient covid x 11 days, states that he feels SOB and states not feeling better, also concerned with dull pain to LLQ. No nausea, no dysuria. sats 96-97%

## 2020-07-12 NOTE — ED Notes (Signed)
Ultrasound completed

## 2020-07-16 ENCOUNTER — Telehealth: Payer: Self-pay

## 2020-07-16 NOTE — Telephone Encounter (Signed)
Please call to let patient know that he can continue tessalon perles as I already prescribed. Does he need a refill? Or he can try delsym OTC for cough. Thanks.

## 2020-07-16 NOTE — Telephone Encounter (Signed)
Pt says he used all of the Z-pak & all the tessalon perles & has used delsym. Pt asking if he should take something more. Pt states breathing problems are resolved. Pt states mostly dry cough with few that are productive. Pt agrees to have refill of tessalon perles & return call to advise if any other steps needed.

## 2020-07-16 NOTE — Telephone Encounter (Addendum)
Pt called to report better symptoms overall but still has a dry lingering cough that is not improved with OTC cough medications. Please advise.   Pt states he will be on a conference call this afternoon so if he does not answer pt request call his wife Asher Muir [phone].

## 2020-07-18 NOTE — Telephone Encounter (Signed)
Please call to let patient know that his cough should improve. He can take Delsym for the cough. Continue deep breathing exercises.

## 2020-07-28 ENCOUNTER — Ambulatory Visit: Payer: Self-pay | Admitting: Physician Assistant

## 2020-07-28 ENCOUNTER — Other Ambulatory Visit: Payer: Self-pay

## 2020-07-29 ENCOUNTER — Encounter: Payer: Self-pay | Admitting: Physician Assistant

## 2020-07-29 ENCOUNTER — Ambulatory Visit (INDEPENDENT_AMBULATORY_CARE_PROVIDER_SITE_OTHER): Payer: Managed Care, Other (non HMO) | Admitting: Physician Assistant

## 2020-07-29 VITALS — BP 120/80 | HR 95 | Temp 98.3°F | Ht 70.5 in | Wt 157.2 lb

## 2020-07-29 DIAGNOSIS — Z8616 Personal history of COVID-19: Secondary | ICD-10-CM | POA: Diagnosis not present

## 2020-07-29 NOTE — Progress Notes (Signed)
Jonathan Elliott is a 44 y.o. male is here to establish care.   I acted as a Neurosurgeon for [employer], PA-C Corky Mull, LPN   History of Present Illness:   Chief Complaint  Patient presents with  . Establish Care  . Covid Positive F/U    Dx on 07/09/2020    HPI   Pt here today to establish care and follow up on his COVID-19 illness.  COVID-19 Pt was dx on 07/09/2020 with COVID-19. Mainly had fatigue, fever up to 102, cough. He was given an infusion on 07/11/20. Pt says he has been asymptomatic for 2 weeks. Denies any lingering symptoms. Cough resolved last week. Never lost taste/smell.   Work has required vaccination, he has submitted an exemption to allow him to delay getting this until his 90 days have passed since his infusion.    Health Maintenance Due  Topic Date Due  . Hepatitis C Screening  Never done  . HIV Screening  Never done    Past Medical History:  Diagnosis Date  . Allergy   . COVID-19 07/09/2020     Social History   Tobacco Use  . Smoking status: Never Smoker  . Smokeless tobacco: Never Used  Vaping Use  . Vaping Use: Never used  Substance Use Topics  . Alcohol use: Never  . Drug use: Never    Past Surgical History:  Procedure Laterality Date  . APPENDECTOMY  2002    Family History  Problem Relation Age of Onset  . Heart disease Father     PMHx, SurgHx, SocialHx, FamHx, Medications, and Allergies were reviewed in the Visit Navigator and updated as appropriate.   Patient Active Problem List   Diagnosis Date Noted  . COVID-19 07/09/2020  . Cough 07/09/2020    Social History   Tobacco Use  . Smoking status: Never Smoker  . Smokeless tobacco: Never Used  Vaping Use  . Vaping Use: Never used  Substance Use Topics  . Alcohol use: Never  . Drug use: Never    Current Medications and Allergies:   No current outpatient medications on file.   Allergies  Allergen Reactions  . Penicillins     Review of Systems   ROS    Negative unless otherwise specified per HPI.  Vitals:   Vitals:   07/29/20 0941  BP: 120/80  Pulse: 95  Temp: 98.3 F (36.8 C)  TempSrc: Temporal  SpO2: 96%  Weight: 157 lb 4 oz (71.3 kg)  Height: 5' 10.5" (1.791 m)     Body mass index is 22.24 kg/m.   Physical Exam:    Physical Exam Vitals and nursing note reviewed.  Constitutional:      General: He is not in acute distress.    Appearance: He is well-developed. He is not ill-appearing or toxic-appearing.  Cardiovascular:     Rate and Rhythm: Normal rate and regular rhythm.     Pulses: Normal pulses.     Heart sounds: Normal heart sounds, S1 normal and S2 normal.     Comments: No LE edema Pulmonary:     Effort: Pulmonary effort is normal.     Breath sounds: Normal breath sounds.  Skin:    General: Skin is warm and dry.  Neurological:     Mental Status: He is alert.     GCS: GCS eye subscore is 4. GCS verbal subscore is 5. GCS motor subscore is 6.  Psychiatric:        Speech: Speech normal.  Behavior: Behavior normal. Behavior is cooperative.      Assessment and Plan:    Jonathan Elliott was seen today for establish care and covid positive f/u.  Diagnoses and all orders for this visit:  Personal history of covid-19   Patient doing well. Letter provided today stating earliest vaccination date is Oct 10 2020. Continue to monitor symptoms.  Jarold Motto, PA-C [address] [place] 07/29/2020  Follow-up: No follow-ups on file.

## 2020-07-29 NOTE — Progress Notes (Deleted)
Jonathan Elliott is a 44 y.o. male here to establish care and annual physical.  I acted as a Neurosurgeon for [employer], PA-C Corky Mull, LPN   History of Present Illness:   No chief complaint on file.   Acute Concerns:   Chronic Issues:   Health Maintenance: Immunizations -- UTD Colonoscopy -- N/A PSA -- N/A Diet -- *** Caffeine intake -- *** Sleep habits -- *** Exercise -- *** Weight --    Mood -- *** Alcohol -- *** Tobacco -- ***  Depression screen [facility] 2/9 07/09/2020  Decreased Interest 0  Down, Depressed, Hopeless 0  PHQ - 2 Score 0    No flowsheet data found.   Other providers/specialists: Patient Care Team: Patient, No Pcp Per as PCP - General (General Practice)   No past medical history on file.   Social History   Tobacco Use  . Smoking status: Never Smoker  . Smokeless tobacco: Never Used  Substance Use Topics  . Alcohol use: Never  . Drug use: Never    No past surgical history on file.  Family History  Problem Relation Age of Onset  . Heart disease Father     Allergies  Allergen Reactions  . Penicillins      Current Medications:   Current Outpatient Medications:  .  azithromycin (ZITHROMAX) 250 MG tablet, Take 2 tablets (500 mg) on day 1, then take 1 tablet (250 mg) on days 2-5, Disp: 6 tablet, Rfl: 0 .  benzonatate (TESSALON) 100 MG capsule, Take 1 capsule (100 mg total) by mouth 2 (two) times daily as needed for cough., Disp: 20 capsule, Rfl: 0 .  predniSONE (DELTASONE) 10 MG tablet, Take 4 tabs for 2 days, then 3 tabs for 2 days, then 2 tabs for 2 days, then 1 tab for 2 days, then stop, Disp: 20 tablet, Rfl: 0   Review of Systems:   ROS  Vitals:   There were no vitals filed for this visit.    There is no height or weight on file to calculate BMI.  Physical Exam:   Physical Exam  Results for orders placed or performed during the hospital encounter of 07/12/20  Lipase, blood  Result Value Ref Range   Lipase 52 (H)  11 - 51 U/L  Comprehensive metabolic panel  Result Value Ref Range   Sodium 140 135 - 145 mmol/L   Potassium 3.8 3.5 - 5.1 mmol/L   Chloride 105 98 - 111 mmol/L   CO2 25 22 - 32 mmol/L   Glucose, Bld 110 (H) 70 - 99 mg/dL   BUN 13 6 - 20 mg/dL   Creatinine, Ser 4.58 0.61 - 1.24 mg/dL   Calcium 8.8 (L) 8.9 - 10.3 mg/dL   Total Protein 6.6 6.5 - 8.1 g/dL   Albumin 3.5 3.5 - 5.0 g/dL   AST 46 (H) 15 - 41 U/L   ALT 41 0 - 44 U/L   Alkaline Phosphatase 43 38 - 126 U/L   Total Bilirubin 0.8 0.3 - 1.2 mg/dL   GFR calc non Af Amer >60 >60 mL/min   GFR calc Af Amer >60 >60 mL/min   Anion gap 10 5 - 15  CBC  Result Value Ref Range   WBC 7.4 4.0 - 10.5 K/uL   RBC 4.93 4.22 - 5.81 MIL/uL   Hemoglobin 14.7 13.0 - 17.0 g/dL   HCT 09.9 39 - 52 %   MCV 91.5 80.0 - 100.0 fL   MCH 29.8 26.0 - 34.0  pg   MCHC 32.6 30.0 - 36.0 g/dL   RDW 74.1 42.3 - 95.3 %   Platelets 187 150 - 400 K/uL   nRBC 0.0 0.0 - 0.2 %  Urinalysis, Routine w reflex microscopic  Result Value Ref Range   Color, Urine YELLOW YELLOW   APPearance CLEAR CLEAR   Specific Gravity, Urine 1.013 1.005 - 1.030   pH 7.0 5.0 - 8.0   Glucose, UA NEGATIVE NEGATIVE mg/dL   Hgb urine dipstick SMALL (A) NEGATIVE   Bilirubin Urine NEGATIVE NEGATIVE   Ketones, ur NEGATIVE NEGATIVE mg/dL   Protein, ur 30 (A) NEGATIVE mg/dL   Nitrite NEGATIVE NEGATIVE   Leukocytes,Ua NEGATIVE NEGATIVE   RBC / HPF 0-5 0 - 5 RBC/hpf   WBC, UA 0-5 0 - 5 WBC/hpf   Bacteria, UA NONE SEEN NONE SEEN   Squamous Epithelial / LPF 0-5 0 - 5    Assessment and Plan:   There are no diagnoses linked to this encounter.  . Reviewed expectations re: course of current medical issues. . Discussed self-management of symptoms. . Outlined signs and symptoms indicating need for more acute intervention. . Patient verbalized understanding and all questions were answered. . See orders for this visit as documented in the electronic medical record. . Patient received an  After-Visit Summary.  ***  Jarold Motto, PA-C

## 2020-07-29 NOTE — Patient Instructions (Signed)
It was great to see you!  Please be sure to tell [facility] that we are your PCP office so we can be in the loop with everything going on. Best of luck!  Let me know if you need anything regarding your covid exemption letter.  Take care,  Jarold Motto PA-C

## 2020-08-07 ENCOUNTER — Ambulatory Visit: Payer: Managed Care, Other (non HMO)

## 2021-05-12 ENCOUNTER — Encounter: Payer: Self-pay | Admitting: Family Medicine

## 2021-05-12 ENCOUNTER — Telehealth (INDEPENDENT_AMBULATORY_CARE_PROVIDER_SITE_OTHER): Payer: Managed Care, Other (non HMO) | Admitting: Family Medicine

## 2021-05-12 VITALS — Temp 98.7°F

## 2021-05-12 DIAGNOSIS — U071 COVID-19: Secondary | ICD-10-CM

## 2021-05-12 NOTE — Progress Notes (Signed)
Virtual Visit via Video Note  I connected with Jonathan Elliott  on 05/12/21 at  3:40 PM EDT by a video enabled telemedicine application and verified that I am speaking with the correct person using two identifiers.  Location patient: home, [place] Location provider:work or home office Persons participating in the virtual visit: patient, provider  I discussed the limitations of evaluation and management by telemedicine and the availability of in person appointments. The patient expressed understanding and agreed to proceed.   HPI:  Acute telemedicine visit for Covid19: -Onset: yesterday; had positive test yesterday -Symptoms include: cough, nasal congestion, mild HA, mildly tired -reports is feeling better today with HA and fatigue resolved -Denies: CP, SOB, fever, NVD, inability to eat/drink/get out of bed -Pertinent past medical history: COVID19 - o/w healthy per his report -Pertinent medication allergies:  Allergies  Allergen Reactions   Penicillins   -COVID-19 vaccine status: vaccinated x2; had covid last year  ROS: See pertinent positives and negatives per HPI.  Past Medical History:  Diagnosis Date   Allergy    COVID-19 07/09/2020    Past Surgical History:  Procedure Laterality Date   APPENDECTOMY  2002    No current outpatient medications on file.  EXAM:  VITALS per patient if applicable:  GENERAL: alert, oriented, appears well and in no acute distress  HEENT: atraumatic, conjunttiva clear, no obvious abnormalities on inspection of external nose and ears  NECK: normal movements of the head and neck  LUNGS: on inspection no signs of respiratory distress, breathing rate appears normal, no obvious gross SOB, gasping or wheezing  CV: no obvious cyanosis  MS: moves all visible extremities without noticeable abnormality  PSYCH/NEURO: pleasant and cooperative, no obvious depression or anxiety, speech and thought processing grossly intact  ASSESSMENT AND PLAN:  Discussed  the following assessment and plan:  COVID-19   Discussed treatment options, ideal treatment window, potential complications, isolation and precautions for COVID-19.  After lengthy discussion, the patient declined referral for Covid outpatient treatment at this time as does not have any high risk conditions for covid complications. Symptomatic care measures summarized in patient instructions. Work/School slipped offered: declined Advised to seek prompt in person care if worsening, new symptoms arise, or if is not improving with treatment. Discussed options for inperson care if PCP office not available. Did let this patient know that I only do telemedicine on Tuesdays and Thursdays for [place]. Advised to schedule follow up visit with PCP or [facility] if any further questions or concerns to avoid delays in care.   I discussed the assessment and treatment plan with the patient. The patient was provided an opportunity to ask questions and all were answered. The patient agreed with the plan and demonstrated an understanding of the instructions.     Terressa Koyanagi, DO

## 2021-05-12 NOTE — Patient Instructions (Signed)
  HOME CARE TIPS:  -can use tylenol or aleve if needed for fevers, aches and pains per instructions  -can use nasal saline a few times per day if you have nasal congestion; sometimes  a short course of Afrin nasal spray for 3 days can help with symptoms as well  -stay hydrated, drink plenty of fluids and eat small healthy meals - avoid dairy  -can take 1000 IU (25mcg) Vit D3 and 100-500 mg of Vit C daily per instructions  -check out the CDC website for more information on home care, transmission and treatment for COVID19  -follow up with your doctor in 2-3 days unless improving and feeling better  -stay home while sick, except to seek medical care. If you have COVID19, ideally it would be best to stay home for a full 10 days since the onset of symptoms PLUS one day of no fever and feeling better. Wear a good mask that fits snugly (such as N95 or KN95) if around others to reduce the risk of transmission.  It was nice to meet you today, and I really hope you are feeling better soon. I help [facility] out with telemedicine visits on Tuesdays and Thursdays and am available for visits on those days. If you have any concerns or questions following this visit please schedule a follow up visit with your Primary Care doctor or seek care at a local urgent care clinic to avoid delays in care.    Seek in person care or schedule a follow up video visit promptly if your symptoms worsen, new concerns arise or you are not improving with treatment. Call 911 and/or seek emergency care if your symptoms are severe or life threatening.   

## 2021-05-16 IMAGING — DX DG CHEST 1V PORT
2 series · 2 of 2 positions shown · non-contrast
Comparison: None.

CLINICAL DATA: Chest pain COVID positive

EXAM:
PORTABLE CHEST 1 VIEW

[chest ap (1 of 2)]
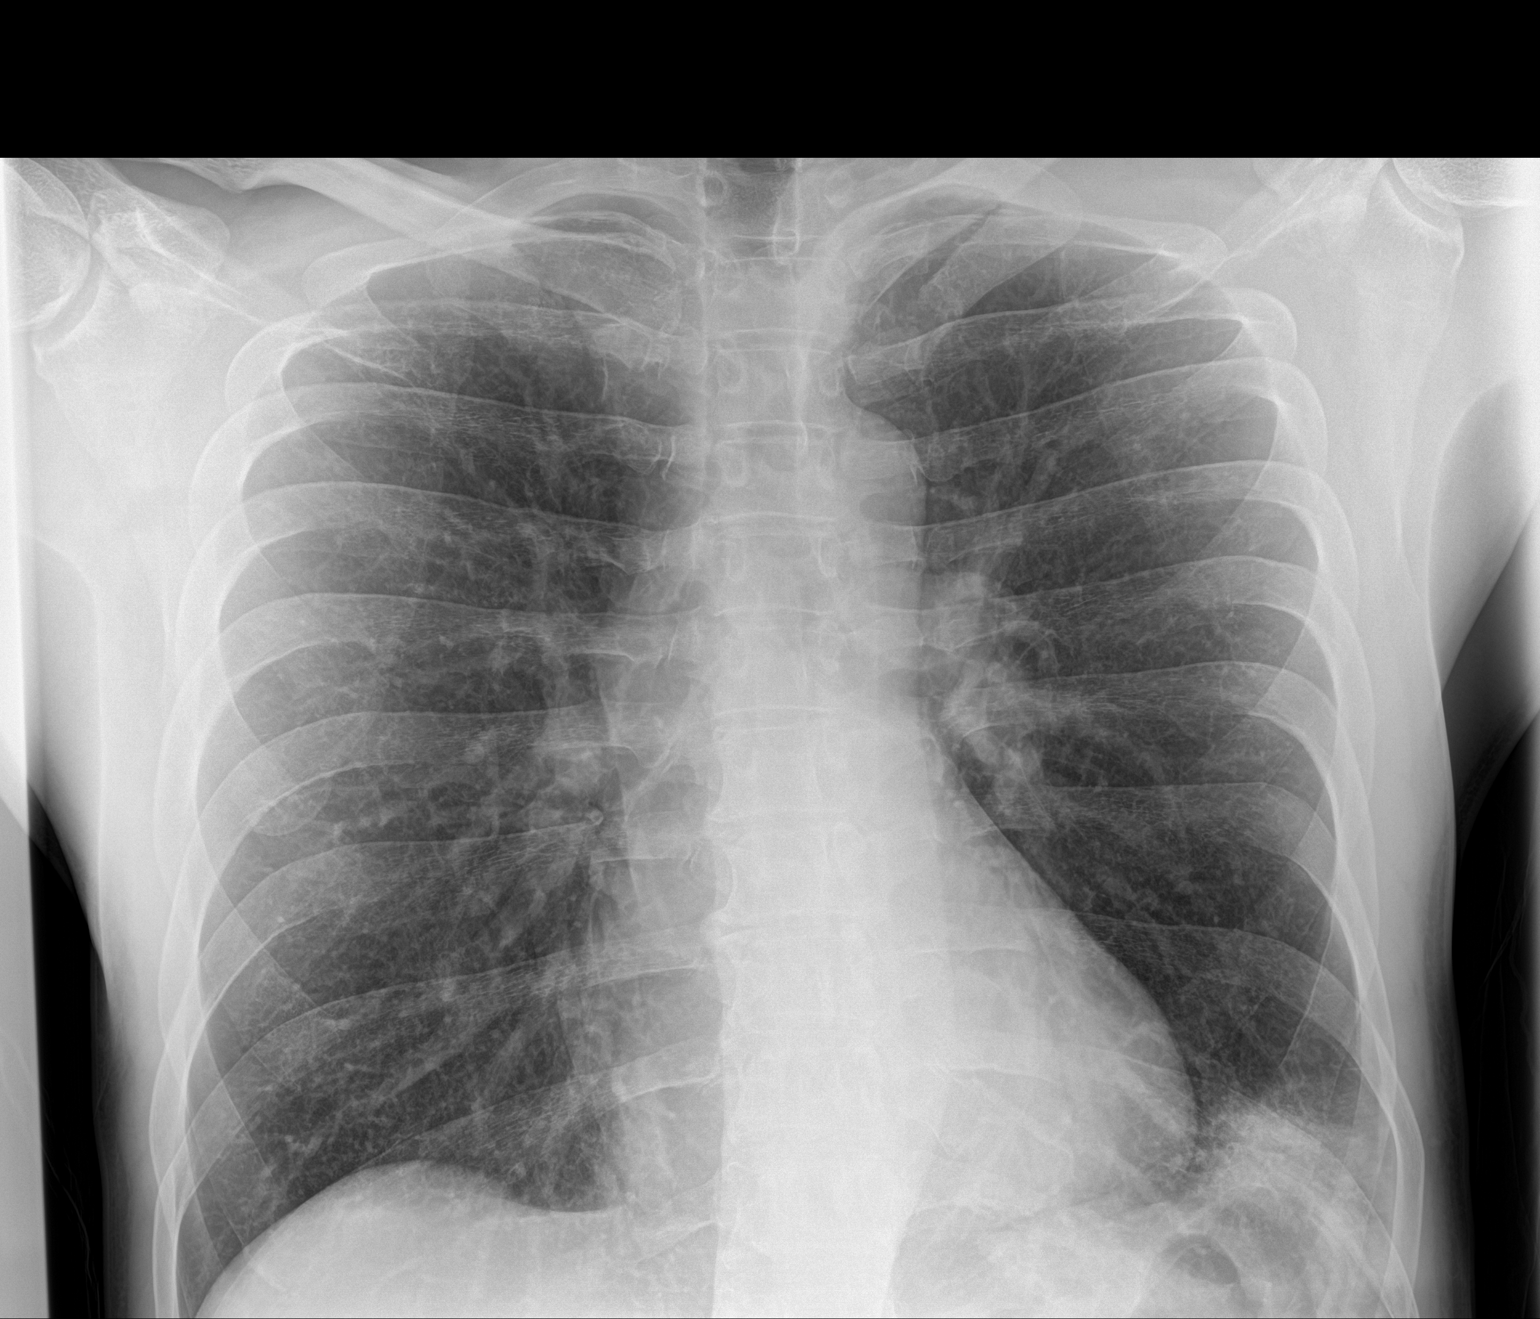

[chest ap (2 of 2)]
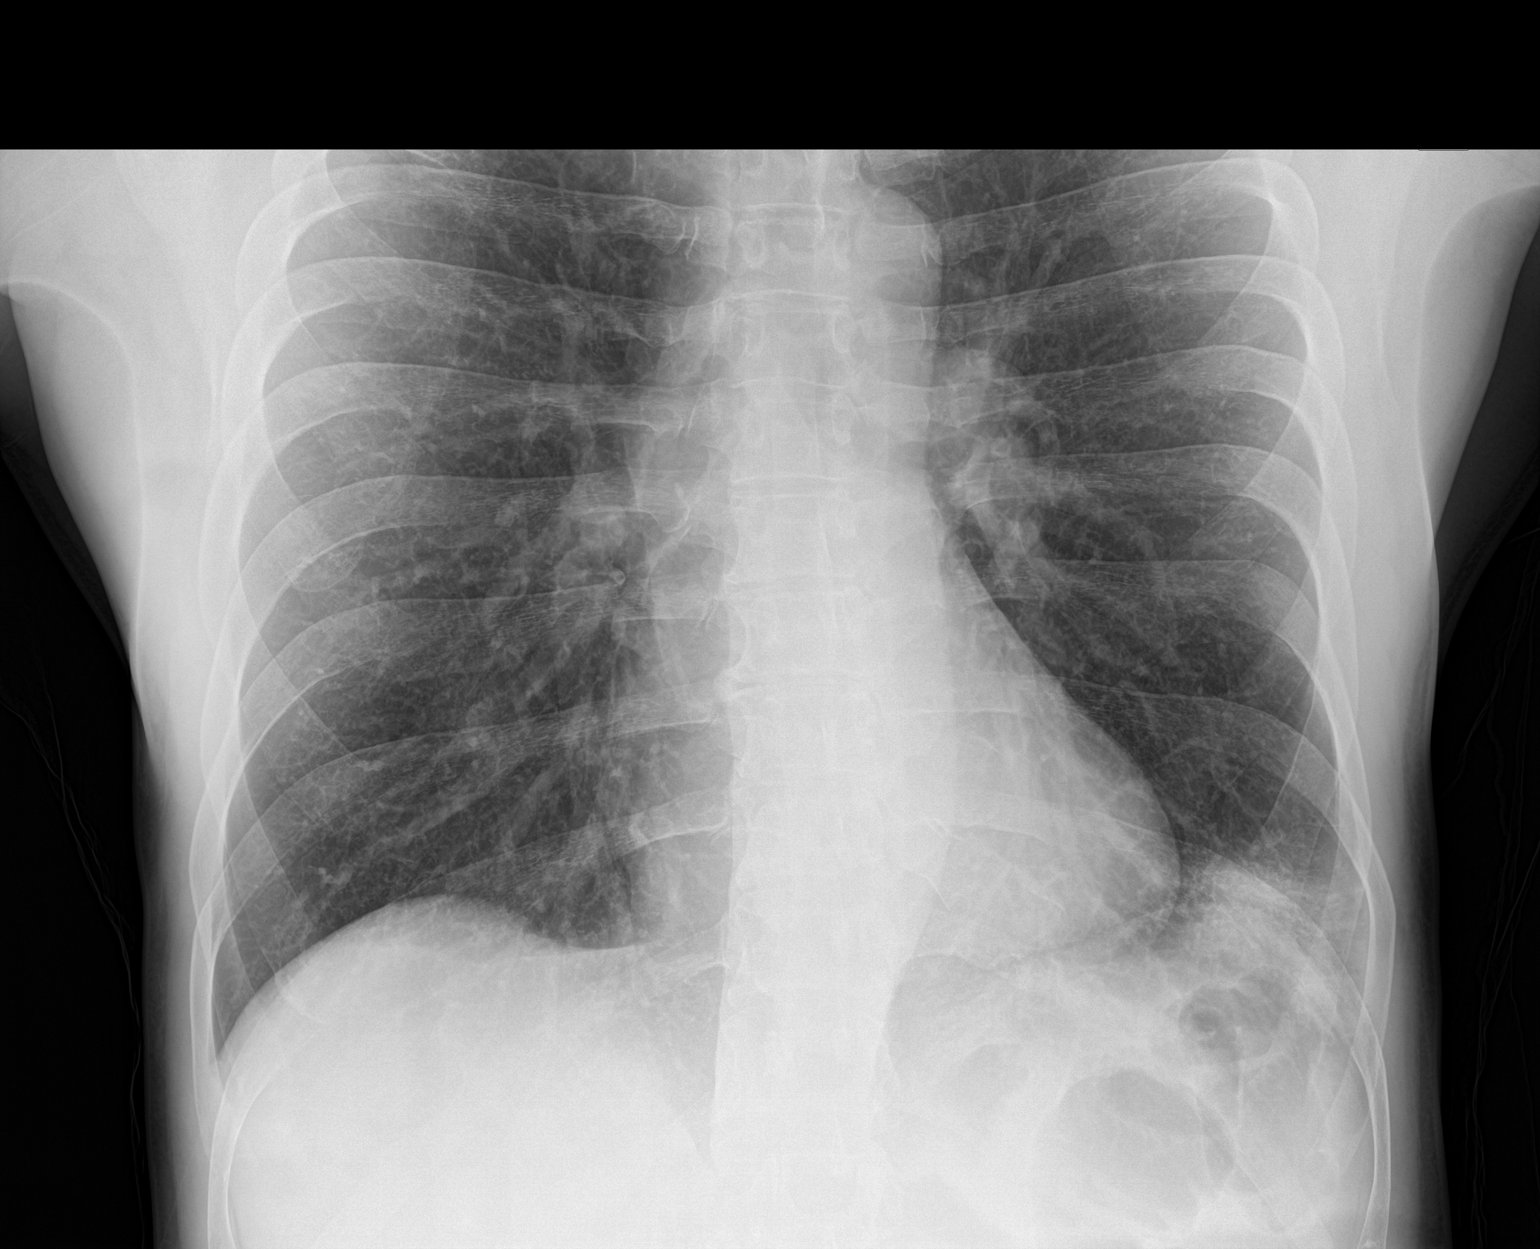

[2 of 2 positions shown; findings below may reference images not displayed]

FINDINGS: The heart size and mediastinal contours are within normal limits.
Hazy airspace opacity seen at the left lung base. The visualized
skeletal structures are unremarkable.
IMPRESSION: Hazy airspace opacity at the left lung base which could be due to
early infectious etiology.

## 2021-05-20 IMAGING — DX DG CHEST 1V PORT
1 series · 2 of 2 positions shown · non-contrast
Comparison: 07/07/2020

CLINICAL DATA: Fever.  COVID positive.

EXAM:
PORTABLE CHEST 1 VIEW

[Series 1: chest · 0.14mm/px · 2 of 2 slices shown]
[im 1/2]
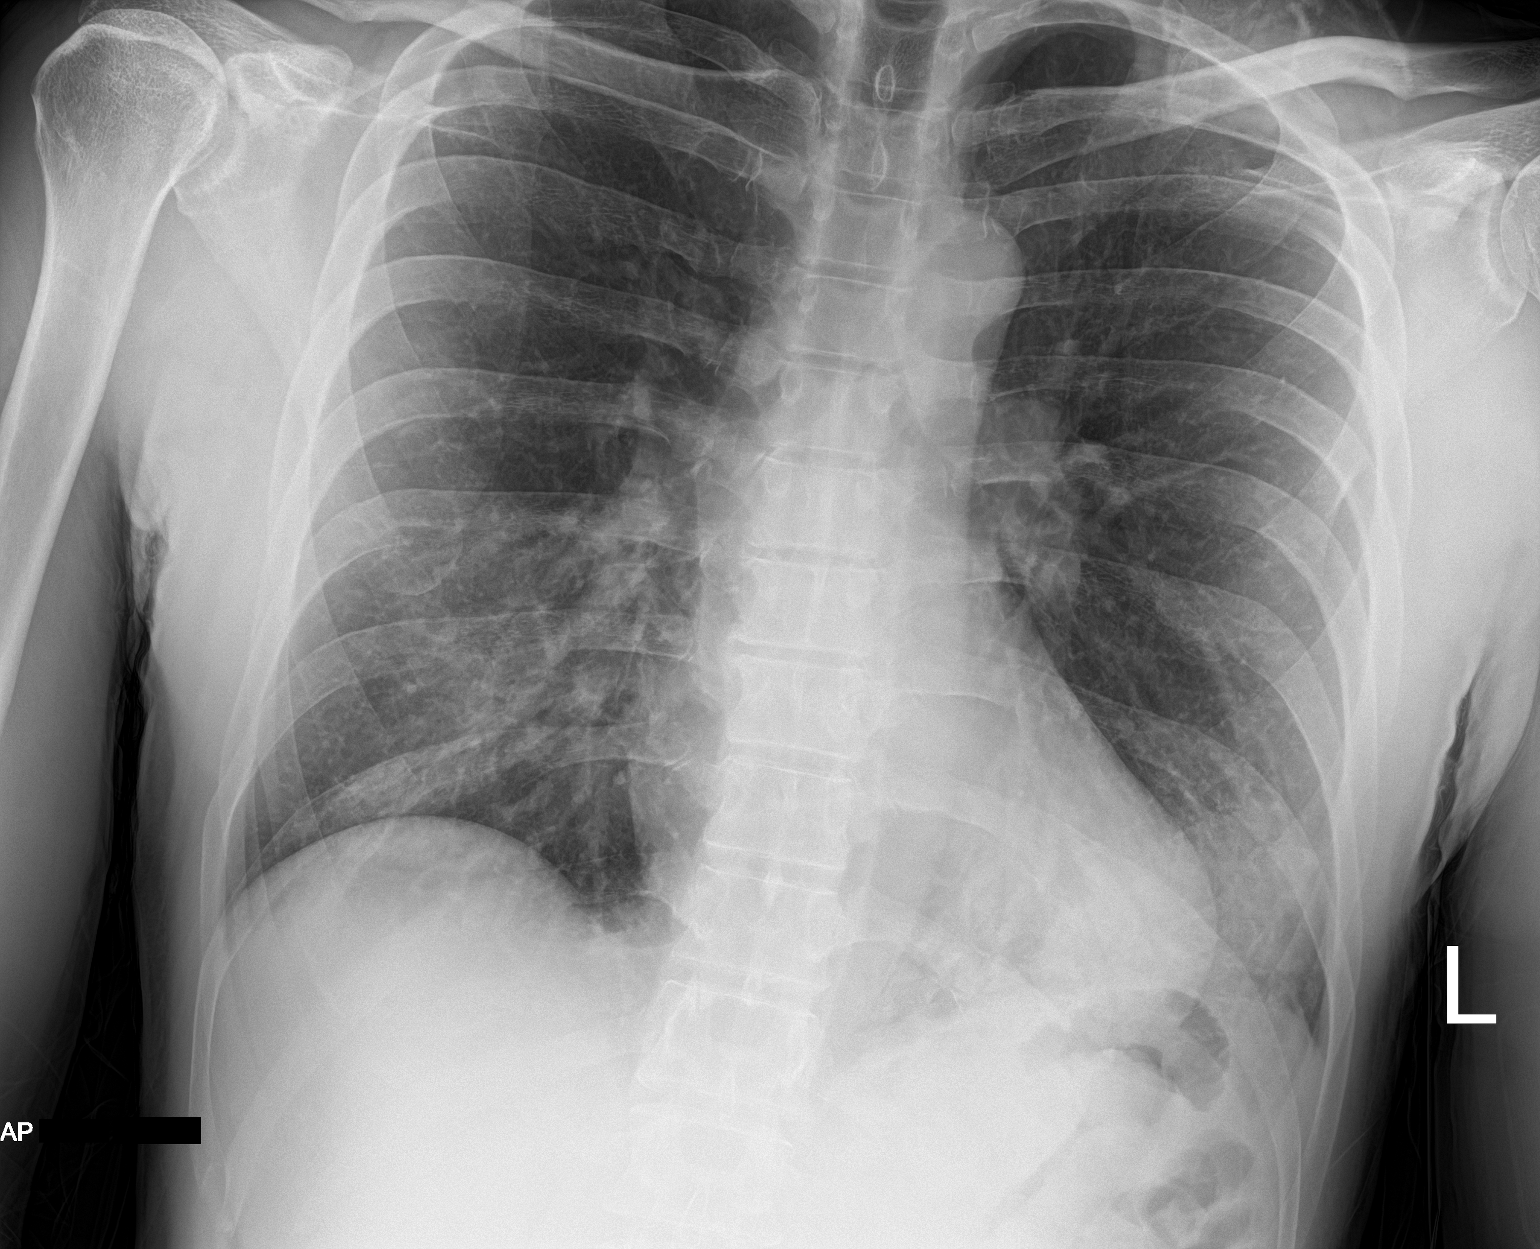
[im 2/2]
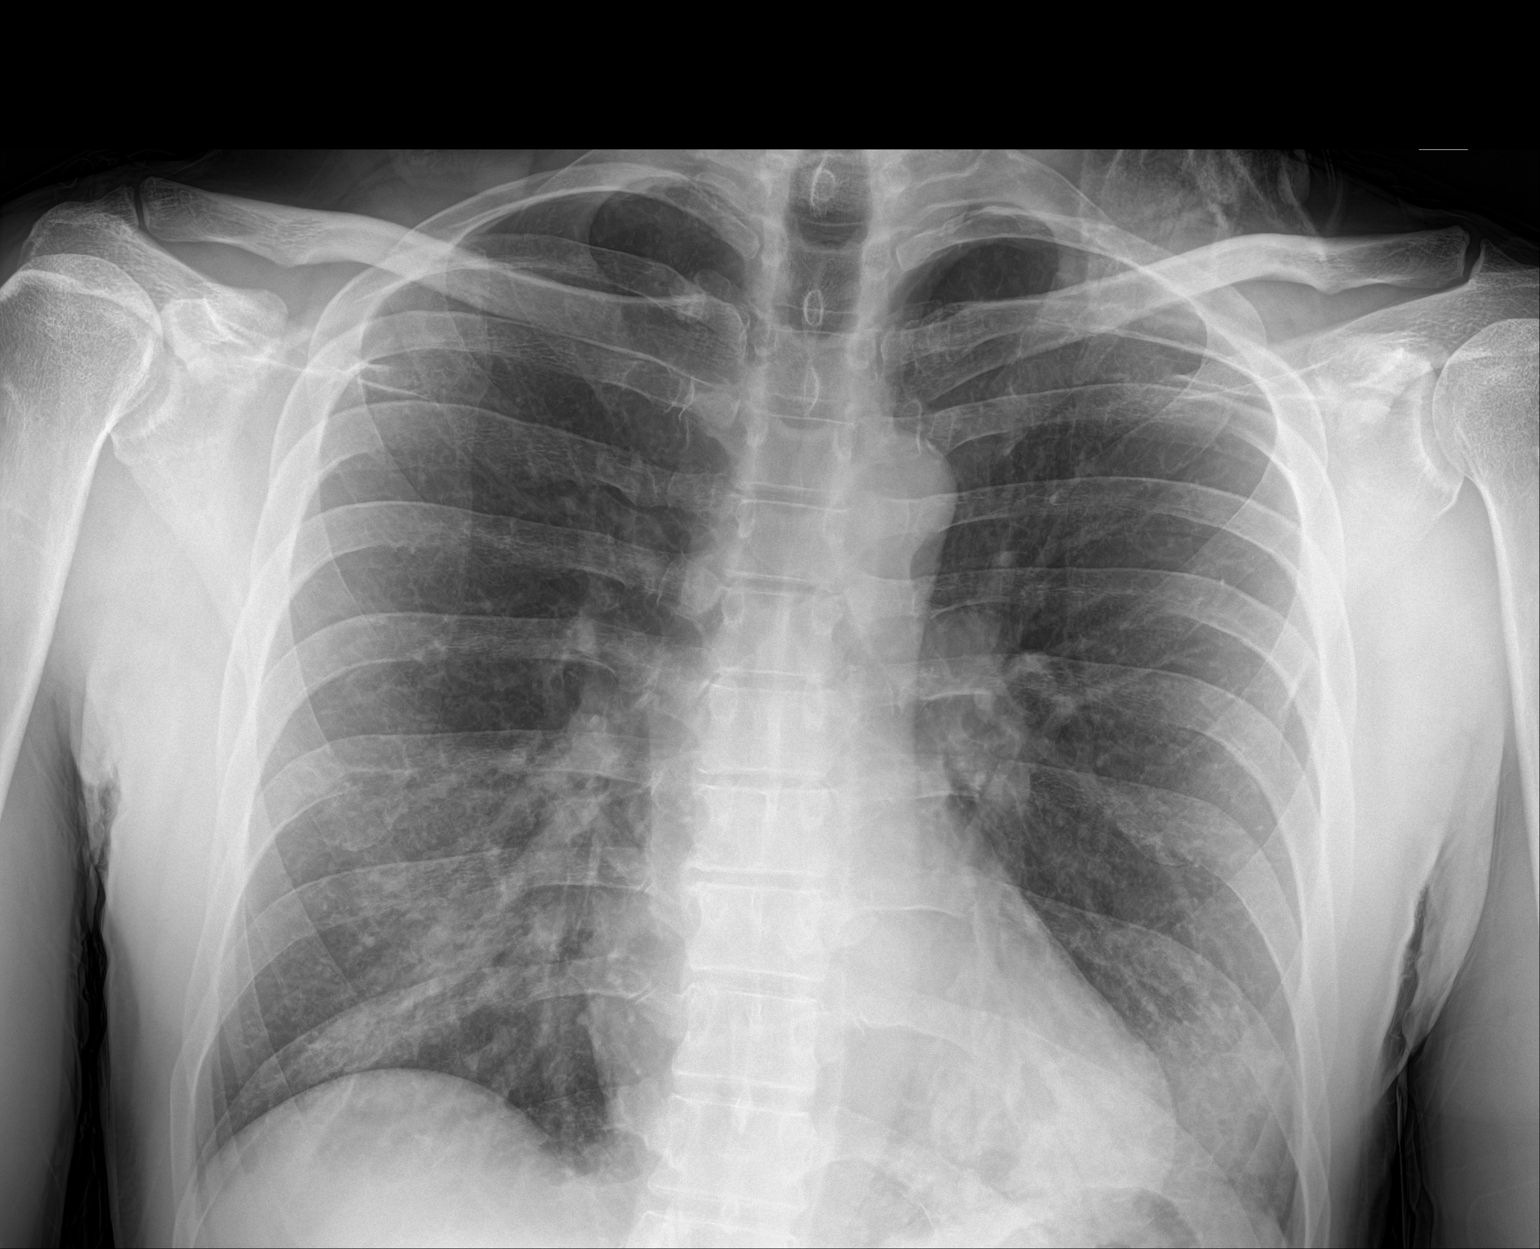

[2 of 2 positions shown; findings below may reference images not displayed]

FINDINGS: Progressive left basilar opacity now with air bronchograms.
Development of hazy opacity in the right mid lower lung zone. No
pneumothorax. Stable heart size and mediastinal contours. No
significant pleural effusion. No acute osseous abnormalities are
seen.
IMPRESSION: Progressive COVID pneumonia over the last 4 days.

## 2021-05-21 IMAGING — US US SCROTUM W/ DOPPLER COMPLETE
1 series · 13 of 25 positions shown · non-contrast
Comparison: None.

CLINICAL DATA: Left testicular mass and dull pain since February 2020.

EXAM:
SCROTAL ULTRASOUND
DOPPLER ULTRASOUND OF THE TESTICLES
TECHNIQUE: Complete ultrasound examination of the testicles, epididymis, and
other scrotal structures was performed. Color and spectral Doppler
ultrasound were also utilized to evaluate blood flow to the
testicles.

[Series 1: us scrotum w/doppler · 13 of 28 slices shown]
[im 1/28]
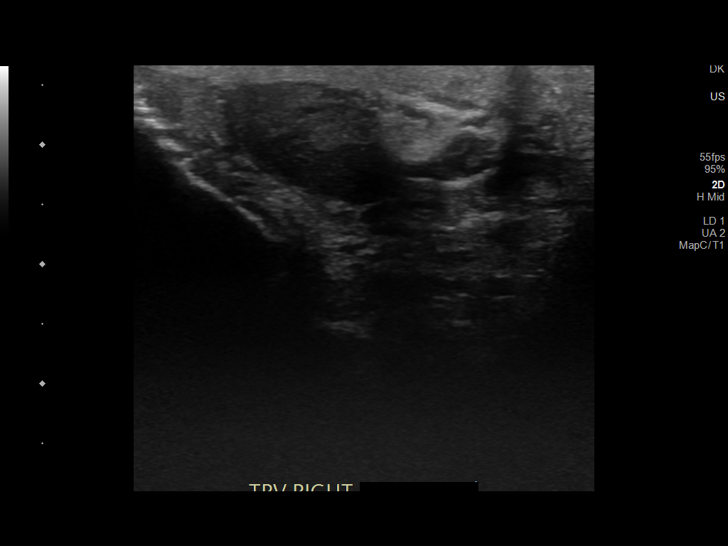
[im 3/28]
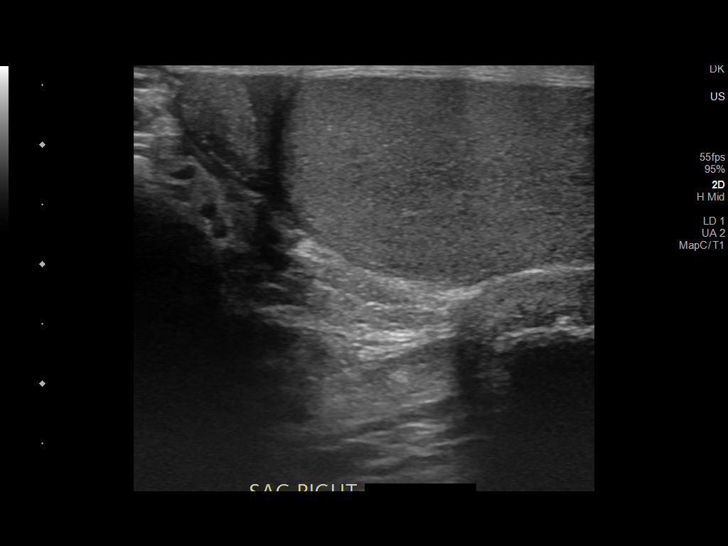
[im 5/28]
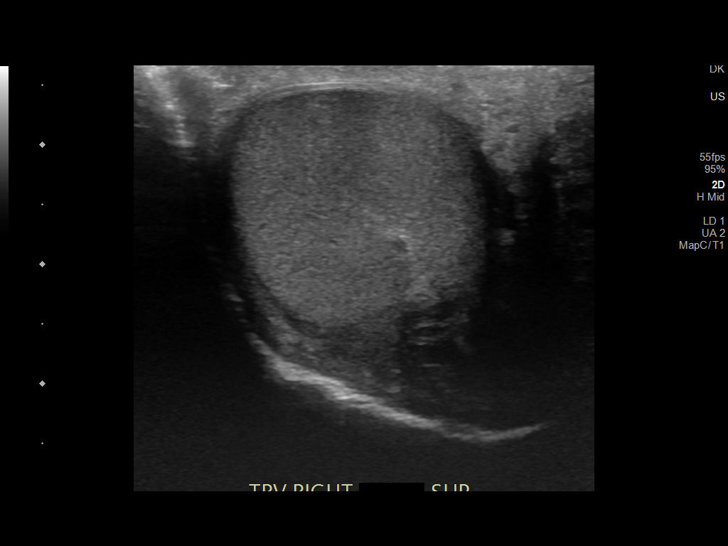
[im 7/28]
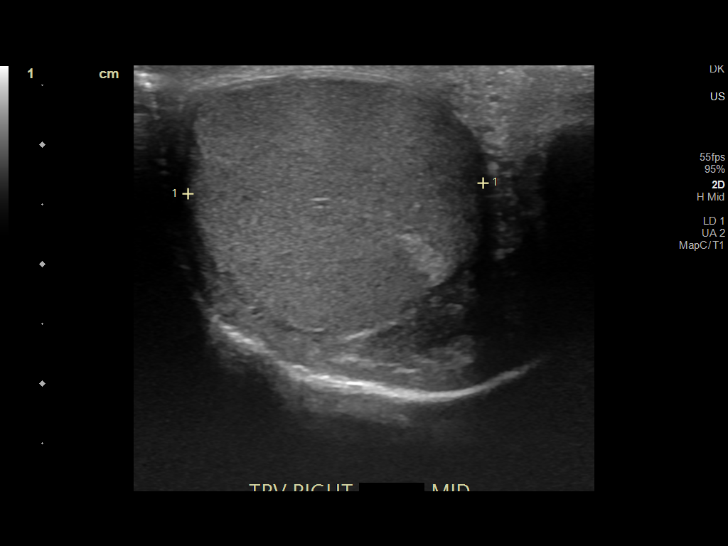
[im 10/28]
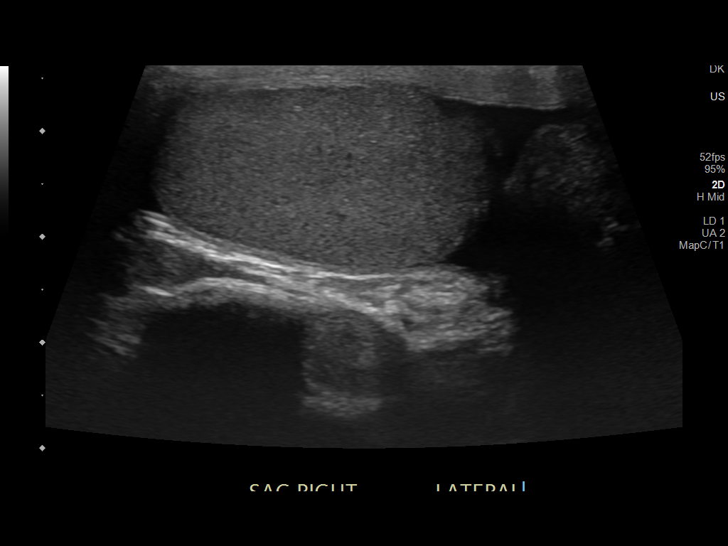
[im 12/28]
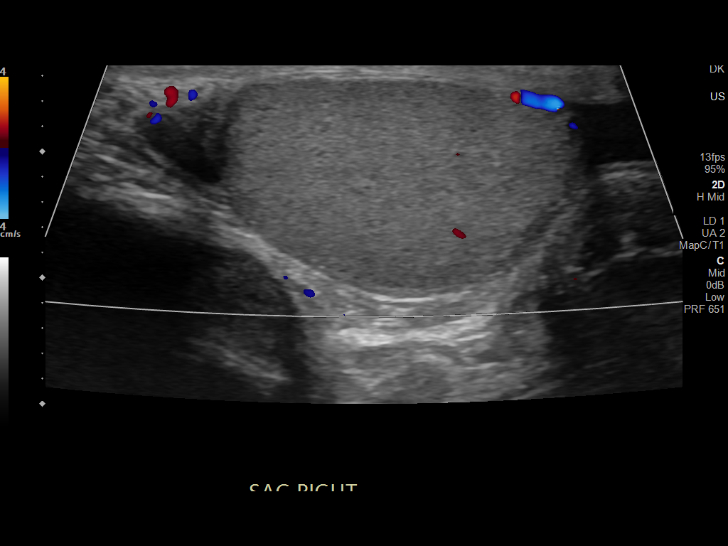
[im 14/28]
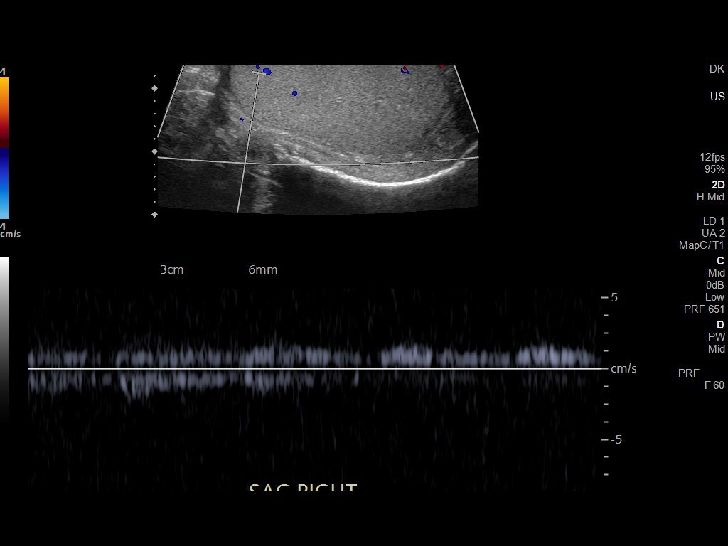
[im 16/28]
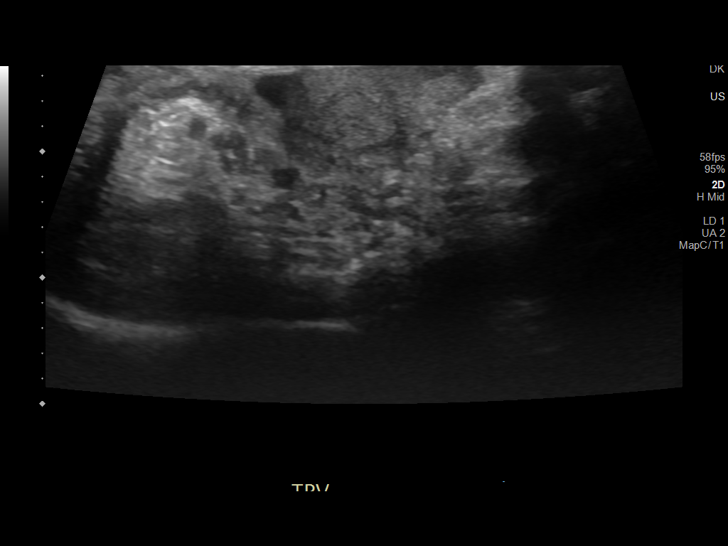
[im 19/28]
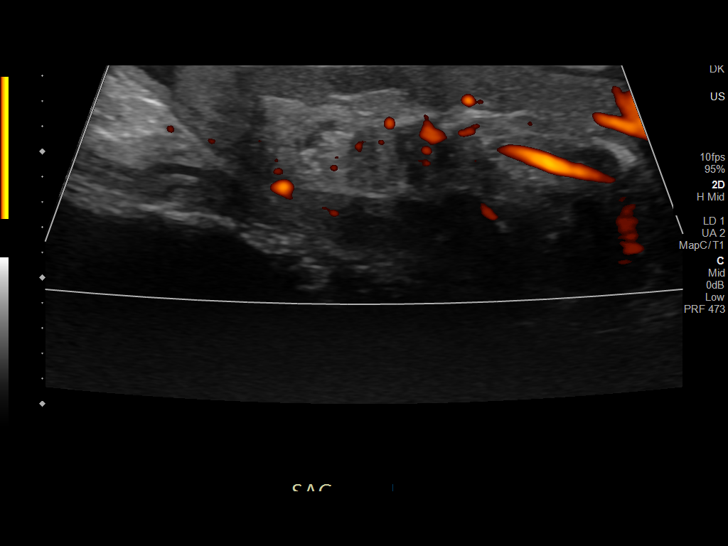
[im 21/28]
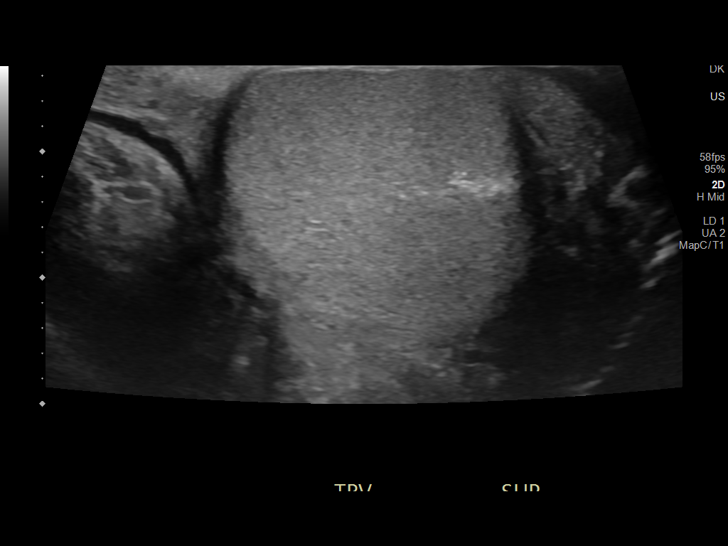
[im 23/28]
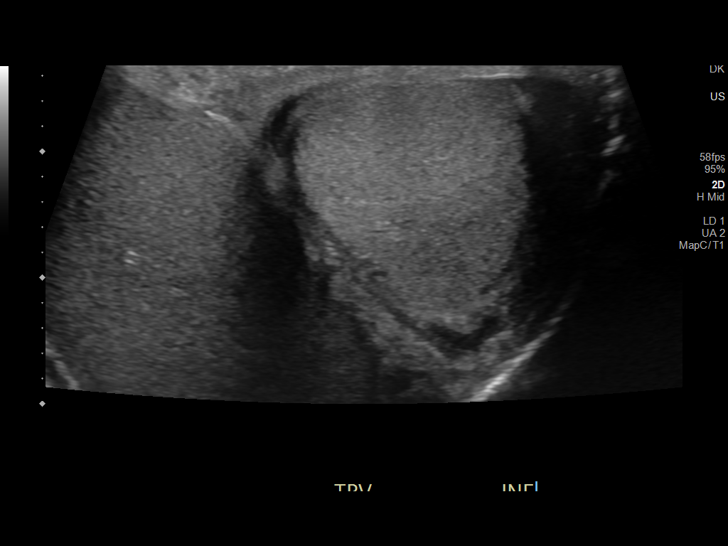
[im 25/28]
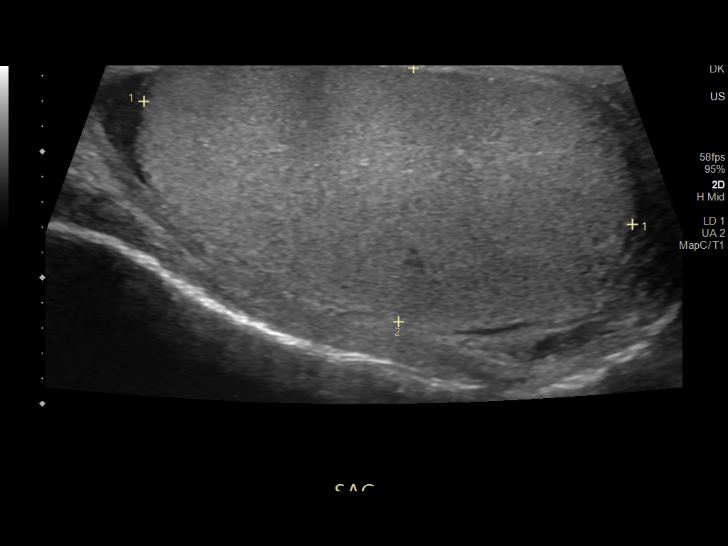
[im 28/28]
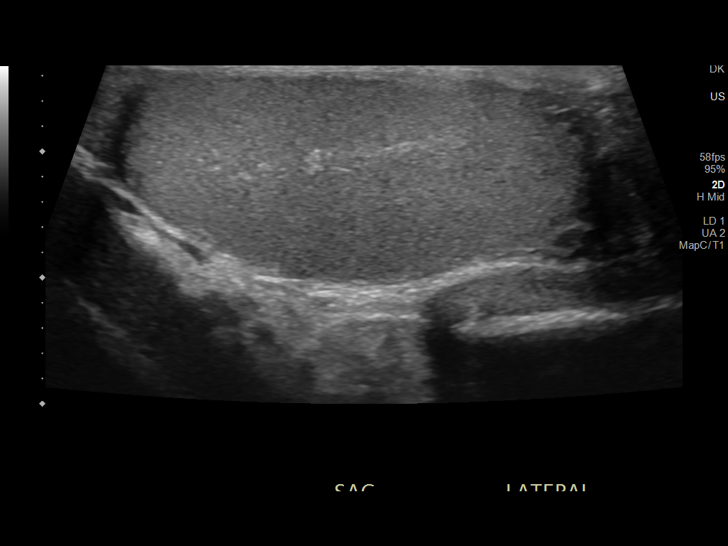

[13 of 25 positions shown; findings below may reference images not displayed]

FINDINGS: Right testicle

Measurements: 3.8 x 2.5 x 1.8 cm. No mass or microlithiasis
visualized.

Left testicle

Measurements: 4.0 x 2.9 x 2.5 cm. No mass or microlithiasis
visualized.

Right epididymis:  Normal in size and appearance.

Left epididymis: Diffusely enlarged and heterogeneous with central
increased echogenicity containing serpiginous hypoechoic areas. The
majority of the hypoechoic areas do not demonstrate internal blood
flow with power Doppler. There is also a 4 mm epididymal cyst
superiorly.

Hydrocele:  None visualized.

Varicocele: Possible left varicocele, difficult to separate from the
apparent epididymal mass.

Pulsed Doppler interrogation of both testes demonstrates normal low
resistance arterial and venous waveforms bilaterally.
IMPRESSION: 1. Diffusely enlarged and heterogeneous left epididymis, as
described above. This is concerning for and epididymal mass. This is
not typical for chronic epididymitis. Elective outpatient MRI the
scrotum may provide useful additional information.
2. 4 mm left epididymal cyst.
3. Possible left varicocele, difficult to separate from the apparent
epididymal mass.
4. Normal appearing testicles and right epididymis.

## 2021-06-26 NOTE — Telephone Encounter (Signed)
error 

## 2021-12-07 NOTE — Progress Notes (Signed)
Jonathan Elliott is a 46 y.o. male here for lower back pain.  History of Present Illness:   Chief Complaint  Patient presents with   Back Pain    PT c/o lower back pain, first started after working in the yard in Dec got better. He said pain started again 3 weeks ago has been consistent. Pt denies any radiation and no urinary symptoms. He has taken Advil with little relief.    HPI  Lower Back Pain Prior to Christmas was washing his car and had some initial pain. He states that his symptoms were mild and went away on their own with supportive care including ibuprofen, heat, stretching.  The last two weeks felt like has had increase in pain, he is unsure what he may have done. Has tightness on both sides of his lower back, a little more on the left. Exercise and stretching has made it somewhat immediately better but not overall improving. Bending over causes the most symptoms.   Denies: Fever, chills, malaise, urinary symptoms, bowel or bladder incontinence, weakness in legs  Past Medical History:  Diagnosis Date   Allergy    COVID-19 07/09/2020     Social History   Tobacco Use   Smoking status: Never   Smokeless tobacco: Never  Vaping Use   Vaping Use: Never used  Substance Use Topics   Alcohol use: Never   Drug use: Never    Past Surgical History:  Procedure Laterality Date   APPENDECTOMY  2002    Family History  Problem Relation Age of Onset   Heart disease Father     Allergies  Allergen Reactions   Penicillins     Current Medications:   Current Outpatient Medications:    Ibuprofen (ADVIL) 200 MG CAPS, Take 400 mg by mouth as needed., Disp: , Rfl:    Review of Systems:   ROS Negative unless otherwise specified per HPI.  Vitals:   Vitals:   12/08/21 0801  BP: 116/70  Pulse: 82  Temp: 98.4 F (36.9 C)  TempSrc: Temporal  SpO2: 97%  Weight: 167 lb 4 oz (75.9 kg)  Height: 5' 10.5" (1.791 m)     Body mass index is 23.66 kg/m.  Physical Exam:    Physical Exam Vitals and nursing note reviewed.  Constitutional:      General: He is not in acute distress.    Appearance: He is well-developed. He is not ill-appearing or toxic-appearing.  Cardiovascular:     Rate and Rhythm: Normal rate and regular rhythm.     Pulses: Normal pulses.     Heart sounds: Normal heart sounds, S1 normal and S2 normal.  Pulmonary:     Effort: Pulmonary effort is normal.     Breath sounds: Normal breath sounds.  Musculoskeletal:     Comments: Decreased ROM 2/2 pain with flexion  No decreased ROM 2/2 pain with extension, lateral side bends, or rotation. Reproducible tenderness with deep palpation to bilateral paraspinal muscles. No bony tenderness. No evidence of erythema, rash or ecchymosis. Negative STLR bilaterally.   Skin:    General: Skin is warm and dry.  Neurological:     Mental Status: He is alert.     GCS: GCS eye subscore is 4. GCS verbal subscore is 5. GCS motor subscore is 6.  Psychiatric:        Speech: Speech normal.        Behavior: Behavior normal. Behavior is cooperative.    Assessment and Plan:   Acute bilateral low  back pain without sciatica No red flags on exam Suspect lumbar muscle strain Recommend ongoing gentle stretching, ibuprofen, heat I have also put in a referral for him to see physical therapy I did offer a muscle relaxer, he declined I discussed with him that if he has any new or worsening symptoms to let us know  I,Havlyn C Ratchford,acting as a scribe for [employer], PA.,have documented all relevant documentation on the behalf of Jarold Motto, PA,as directed by  Jarold Motto, PA while in the presence of Jarold Motto, [place].  I, Jarold Motto, [place], have reviewed all documentation for this visit. The documentation on 12/08/21 for the exam, diagnosis, procedures, and orders are all accurate and complete.  Jarold Motto, PA-C

## 2021-12-08 ENCOUNTER — Ambulatory Visit (INDEPENDENT_AMBULATORY_CARE_PROVIDER_SITE_OTHER): Payer: Managed Care, Other (non HMO) | Admitting: Physician Assistant

## 2021-12-08 ENCOUNTER — Encounter: Payer: Self-pay | Admitting: Physician Assistant

## 2021-12-08 ENCOUNTER — Other Ambulatory Visit: Payer: Self-pay

## 2021-12-08 VITALS — BP 116/70 | HR 82 | Temp 98.4°F | Ht 70.5 in | Wt 167.2 lb

## 2021-12-08 DIAGNOSIS — M545 Low back pain, unspecified: Secondary | ICD-10-CM | POA: Diagnosis not present

## 2021-12-08 NOTE — Patient Instructions (Signed)
It was great to see you!  Please schedule a physical therapy appointment here at our office for your back with Elon Jester or Leotis Shames.  Follow-up me as needed.  Take care,  Jarold Motto PA-C

## 2023-06-26 ENCOUNTER — Emergency Department (HOSPITAL_BASED_OUTPATIENT_CLINIC_OR_DEPARTMENT_OTHER)
Admission: EM | Admit: 2023-06-26 | Discharge: 2023-06-26 | Disposition: A | Discharge status: Home / Self Care | Payer: Managed Care, Other (non HMO) | Attending: Emergency Medicine | Admitting: Emergency Medicine

## 2023-06-26 ENCOUNTER — Other Ambulatory Visit: Payer: Self-pay

## 2023-06-26 ENCOUNTER — Encounter (HOSPITAL_BASED_OUTPATIENT_CLINIC_OR_DEPARTMENT_OTHER): Payer: Self-pay | Admitting: Emergency Medicine

## 2023-06-26 DIAGNOSIS — W293XXA Contact with powered garden and outdoor hand tools and machinery, initial encounter: Secondary | ICD-10-CM | POA: Diagnosis not present

## 2023-06-26 DIAGNOSIS — S61215A Laceration without foreign body of left ring finger without damage to nail, initial encounter: Secondary | ICD-10-CM | POA: Diagnosis not present

## 2023-06-26 DIAGNOSIS — S61315A Laceration without foreign body of left ring finger with damage to nail, initial encounter: Secondary | ICD-10-CM

## 2023-06-26 DIAGNOSIS — S6992XA Unspecified injury of left wrist, hand and finger(s), initial encounter: Secondary | ICD-10-CM | POA: Diagnosis present

## 2023-06-26 MED ORDER — LIDOCAINE-EPINEPHRINE (PF) 2 %-1:200000 IJ SOLN
10.0000 mL | Freq: Once | INTRAMUSCULAR | Status: DC
  Filled 2023-06-26: qty 20

## 2023-06-26 MED ORDER — CEPHALEXIN 500 MG PO CAPS: ORAL_CAPSULE | ORAL | 0 refills | Status: DC

## 2023-06-26 NOTE — ED Notes (Signed)
Dc instructions reviewed with patient. Patient voiced understanding. Dc with belongings.  °

## 2023-06-26 NOTE — ED Provider Notes (Signed)
[place] EMERGENCY DEPARTMENT AT [facility] Provider Note   CSN: [id number] Arrival date & time: 06/26/23  1437     History  Chief Complaint  Patient presents with   Finger Injury    Jonathan Elliott is a 47 y.o. male.  47 yo M with a chief complaints of a fingertip laceration.  The patient was trimming some bushes with a hedge tremor that is electric and he inadvertently cut his finger.  Tetanus was updated last year.  Denies any other injury.  Denies foreign body.        Home Medications Prior to Admission medications   Medication Sig Start Date End Date Taking? Authorizing Provider  cephALEXin (KEFLEX) 500 MG capsule 2 caps po bid x 7 days 06/26/23  Yes Melene Plan, DO  Ibuprofen (ADVIL) 200 MG CAPS Take 400 mg by mouth as needed.    [provider]      Allergies    Penicillins    Review of Systems   Review of Systems  Physical Exam Updated Vital Signs BP (!) 144/95   Pulse 80   Temp 98.7 F (37.1 C) (Oral)   Resp 18   Wt 75.8 kg   SpO2 100%   BMI 23.62 kg/m  Physical Exam Vitals and nursing note reviewed.  Constitutional:      Appearance: He is well-developed.  HENT:     Head: Normocephalic and atraumatic.  Eyes:     Pupils: Pupils are equal, round, and reactive to light.  Neck:     Vascular: No JVD.  Cardiovascular:     Rate and Rhythm: Normal rate and regular rhythm.     Heart sounds: No murmur heard.    No friction rub. No gallop.  Pulmonary:     Effort: No respiratory distress.     Breath sounds: No wheezing.  Abdominal:     General: There is no distension.     Tenderness: There is no abdominal tenderness. There is no guarding or rebound.  Musculoskeletal:        General: Normal range of motion.     Cervical back: Normal range of motion and neck supple.     Comments: Laceration to the tip of the left fourth digit goes through the radial aspect of the nail but spares the nailbed.  No exposed bone.  No foreign body   Skin:    Coloration: Skin is not pale.     Findings: No rash.  Neurological:     Mental Status: He is alert and oriented to person, place, and time.  Psychiatric:        Behavior: Behavior normal.     ED Results / Procedures / Treatments   Labs (all labs ordered are listed, but only abnormal results are displayed) Labs Reviewed - No data to display  EKG None  Radiology No results found.  Procedures .Marland KitchenLaceration Repair  Date/Time: 06/26/2023 3:38 PM  Performed by: Melene Plan, DO Authorized by: Melene Plan, DO   Consent:    Consent obtained:  Verbal   Consent given by:  Patient and spouse   Risks discussed:  Infection, pain, poor cosmetic result and poor wound healing   Alternatives discussed:  No treatment and delayed treatment Universal protocol:    Procedure explained and questions answered to patient or proxy's satisfaction: yes     Immediately prior to procedure, a time out was called: yes     Patient identity confirmed:  Verbally with patient Anesthesia:  Anesthesia method:  Nerve block   Block location:  Digital   Block anesthetic:  Lidocaine 2% WITH epi   Block technique:  Digital   Block injection procedure:  Anatomic landmarks identified and anatomic landmarks palpated   Block outcome:  Anesthesia achieved Laceration details:    Location:  Finger   Finger location:  L ring finger   Length (cm):  2.8 Pre-procedure details:    Preparation:  Patient was prepped and draped in usual sterile fashion Exploration:    Limited defect created (wound extended): no     Hemostasis achieved with:  Direct pressure   Wound exploration: entire depth of wound visualized     Wound extent: no underlying fracture   Treatment:    Area cleansed with:  Povidone-iodine, saline and soap and water   Amount of cleaning:  Extensive   Irrigation solution:  Tap water   Irrigation volume:  5 min of tap   Irrigation method:  Tap   Visualized foreign bodies/material removed: no      Debridement:  None   Undermining:  None   Scar revision: no   Skin repair:    Repair method:  Sutures   Suture size:  5-0   Suture material:  Nylon   Suture technique:  Simple interrupted   Number of sutures:  4 Approximation:    Approximation:  Close Repair type:    Repair type:  Simple Post-procedure details:    Dressing:  Antibiotic ointment and adhesive bandage   Procedure completion:  Tolerated well, no immediate complications     Medications Ordered in ED Medications  lidocaine-EPINEPHrine (XYLOCAINE W/EPI) 2 %-1:200000 (PF) injection 10 mL (has no administration in time range)    ED Course/ Medical Decision Making/ A&P                                 Medical Decision Making Risk Prescription drug management.   47 yo M with chief complaints of a laceration to the tip of the left fourth digit.  No obvious foreign body.  No palpable bone.  Irrigated diffusely and sutured at bedside.  As this was cut with lawn care equipment will start on prophylactic antibiotics.  Encouraged him to follow-up with his family doctor.  3:40 PM:  I have discussed the diagnosis/risks/treatment options with the patient.  Evaluation and diagnostic testing in the emergency department does not suggest an emergent condition requiring admission or immediate intervention beyond what has been performed at this time.  They will follow up with PCP. We also discussed returning to the ED immediately if new or worsening sx occur. We discussed the sx which are most concerning (e.g., sudden worsening pain, fever, inability to tolerate by mouth, redness, drainage) that necessitate immediate return. Medications administered to the patient during their visit and any new prescriptions provided to the patient are listed below.  Medications given during this visit Medications  lidocaine-EPINEPHrine (XYLOCAINE W/EPI) 2 %-1:200000 (PF) injection 10 mL (has no administration in time range)     The patient  appears reasonably screen and/or stabilized for discharge and I doubt any other medical condition or other [facility] requiring further screening, evaluation, or treatment in the ED at this time prior to discharge.          Final Clinical Impression(s) / ED Diagnoses Final diagnoses:  Laceration of left ring finger without foreign body with damage to nail, initial encounter  Rx / DC Orders ED Discharge Orders          Ordered    cephALEXin (KEFLEX) 500 MG capsule        06/26/23 1536              Melene Plan, DO 06/26/23 1540

## 2023-06-26 NOTE — ED Notes (Signed)
Wound dressed. ?

## 2023-06-26 NOTE — ED Triage Notes (Signed)
Hedge clippers left ring finger cut .

## 2023-06-26 NOTE — Discharge Instructions (Addendum)
Return for redness drainage or if you get a fever.  Sutures used are made out of nylon.  They are typically taken out between day 7-10.  This can be done here at urgent care or at your family doctors office.  The area can get wet but not fully immersed underwater.  No scrubbing.  If you really want to clean it you can apply a half-and-half hydrogen peroxide solution with water on a Q-tip.  You can apply an ointment a couple times a day this could be as simple as Vaseline but could also be an antibiotic ointment if you wish.  Once it is healed please try to avoid prolonged sun exposure use sunscreen.

## 2023-06-26 NOTE — ED Notes (Signed)
Pt 's left ring finger soaked in betadine

## 2023-07-08 ENCOUNTER — Ambulatory Visit (INDEPENDENT_AMBULATORY_CARE_PROVIDER_SITE_OTHER): Payer: Managed Care, Other (non HMO) | Admitting: Physician Assistant

## 2023-07-08 ENCOUNTER — Encounter: Payer: Self-pay | Admitting: Physician Assistant

## 2023-07-08 VITALS — BP 116/70 | HR 75 | Temp 97.7°F | Resp 18 | Ht 70.5 in | Wt 164.5 lb

## 2023-07-08 DIAGNOSIS — Z4802 Encounter for removal of sutures: Secondary | ICD-10-CM

## 2023-07-08 NOTE — Patient Instructions (Signed)
It was great to see you!  Regular soap and water daily -- no antibacterial soap  Keep covered  Use either Vaseline, Aquaphor or bacitracin to area to keep moist under bandage  Call with any concerns!  Take care,  Jarold Motto PA-C

## 2023-07-08 NOTE — Progress Notes (Signed)
Jonathan Elliott is a 47 y.o. male here for a follow up of a pre-existing problem.  History of Present Illness:   Chief Complaint  Patient presents with   Suture / Staple Removal   Suture removal Patient is having sutures removed today and he reports no complaints this visit. He states that he has completed a week worth of antibiotics, 500 mg cephalexin. Patient changes bandages daily for hygiene maintenance .   He had 4 sutures placed. He is UpToDate on Tetanus, Diphtheria, and Pertussis (Tdap).   Past Medical History:  Diagnosis Date   Allergy    COVID-19 07/09/2020     Social History   Tobacco Use   Smoking status: Never   Smokeless tobacco: Never  Vaping Use   Vaping status: Never Used  Substance Use Topics   Alcohol use: Never   Drug use: Never    Past Surgical History:  Procedure Laterality Date   APPENDECTOMY  2002    Family History  Problem Relation Age of Onset   Heart disease Father     Allergies  Allergen Reactions   Penicillins     Current Medications:   Current Outpatient Medications:    Ibuprofen (ADVIL) 200 MG CAPS, Take 400 mg by mouth as needed., Disp: , Rfl:    Review of Systems:   ROS Negative unless otherwise specified per HPI.  Vitals:   Vitals:   07/08/23 0754  BP: 116/70  Pulse: 75  Resp: 18  Temp: 97.7 F (36.5 C)  TempSrc: Temporal  SpO2: 98%  Weight: 164 lb 8 oz (74.6 kg)  Height: 5' 10.5" (1.791 m)     Body mass index is 23.27 kg/m.  Physical Exam:   Physical Exam Constitutional:      General: He is not in acute distress.    Appearance: Normal appearance. He is not ill-appearing.  HENT:     Head: Normocephalic and atraumatic.     Right Ear: External ear normal.     Left Ear: External ear normal.  Eyes:     Extraocular Movements: Extraocular movements intact.     Pupils: Pupils are equal, round, and reactive to light.  Cardiovascular:     Rate and Rhythm: Normal rate and regular rhythm.     Heart sounds:  Normal heart sounds. No murmur heard.    No gallop.     Comments: Capillary refill is adequate in all fingers Pulmonary:     Effort: Pulmonary effort is normal. No respiratory distress.     Breath sounds: Normal breath sounds. No wheezing or rales.  Skin:    General: Skin is warm and dry.     Comments: 4 sutures intact to left tip of 4th finger Eschar present No discharge or pain  Neurological:     Mental Status: He is alert and oriented to person, place, and time.     Comments: Neurovascularly intact to ring finger  Psychiatric:        Judgment: Judgment normal.    Patient presents for suture removal. The wound is well healed without signs of infection.  The sutures are removed. Wound care and activity instructions given. Return prn.   Assessment and Plan:   Visit for suture removal No red flags Suture removed without any issues Follow-up as needed Wound care discussed  I,Verona Buck,acting as a scribe for [employer], PA.,have documented all relevant documentation on the behalf of Jarold Motto, PA,as directed by  Jarold Motto, PA while in the presence of [address]  [place], [place].  I, [place], [place], have reviewed all documentation for this visit. The documentation on 07/08/23 for the exam, diagnosis, procedures, and orders are all accurate and complete.   Jarold Motto, PA-C

## 2023-08-29 ENCOUNTER — Encounter: Payer: Self-pay | Admitting: Internal Medicine

## 2023-08-29 ENCOUNTER — Ambulatory Visit (INDEPENDENT_AMBULATORY_CARE_PROVIDER_SITE_OTHER): Payer: Managed Care, Other (non HMO) | Admitting: Internal Medicine

## 2023-08-29 VITALS — BP 126/88 | HR 65 | Temp 98.2°F | Ht 70.5 in | Wt 165.4 lb

## 2023-08-29 DIAGNOSIS — R42 Dizziness and giddiness: Secondary | ICD-10-CM

## 2023-08-29 DIAGNOSIS — R55 Syncope and collapse: Secondary | ICD-10-CM

## 2023-08-29 LAB — VITAMIN B12: Vitamin B-12: 545 pg/mL (ref 211–911)

## 2023-08-29 LAB — COMPREHENSIVE METABOLIC PANEL
ALT: 21 U/L (ref 0–53)
AST: 23 U/L (ref 0–37)
Albumin: 4.7 g/dL (ref 3.5–5.2)
Alkaline Phosphatase: 46 U/L (ref 39–117)
BUN: 23 mg/dL (ref 6–23)
CO2: 28 meq/L (ref 19–32)
Calcium: 9.6 mg/dL (ref 8.4–10.5)
Chloride: 105 meq/L (ref 96–112)
Creatinine, Ser: 1.12 mg/dL (ref 0.40–1.50)
GFR: 77.51 mL/min (ref >60.00)
Glucose, Bld: 96 mg/dL (ref 70–99)
Potassium: 4.4 meq/L (ref 3.5–5.1)
Sodium: 141 meq/L (ref 135–145)
Total Bilirubin: 0.6 mg/dL (ref 0.2–1.2)
Total Protein: 7.1 g/dL (ref 6.0–8.3)

## 2023-08-29 NOTE — Progress Notes (Signed)
[facility]: [phone]   -- Medical Office Visit --  Patient:  Jonathan Elliott      Age: 47 y.o.       Sex:  male  Date:   08/29/2023 Patient Care Team: Jarold Motto, [place] as PCP - General (Physician Assistant) Today's Healthcare Provider: Lula Olszewski, MD  Assessment   Plan      Assessment & Plan Vasovagal episode  Lightheadedness  Assessment:  Vasovagal Syncope: The patient experienced an episode of lightheadedness, cold flash, and tingling extremities upon waking and urinating, followed by anxiety, which resolved within 30 minutes. There was no associated chest pain or shortness of breath. The patient has a history of a similar episode years ago, diagnosed as an anxiety attack. Based on the clinical assessment, no further workup is recommended at this time. Fatigue: The patient reports feeling exhausted post-episode, akin to having run a long distance. We will monitor this symptom. Lower Back Pain: The patient reports mild lower back pain, possibly related to a recent workout. We will monitor this symptom as well.  Plan:  Laboratory Tests:  Complete Blood Count (CBC) Comprehensive Metabolic Panel (CMP) Thyroid Stimulating Hormone (TSH) Vitamin B12 level   Cardiac Monitoring:  The patient declined an ECG during this visit, but we recommended considering home cardiac monitoring in the future if he experiences any recurrent episodes.   Symptom Monitoring:  The patient will continue to monitor his symptoms and report any changes or concerns. He was advised to seek immediate medical attention if he experiences any alarm symptoms, such as persistent or worsening chest pain, severe or prolonged shortness of breath, rapid or irregular heartbeat, or sudden and severe dizziness or fainting.      ED Discharge Orders          Ordered    Comprehensive metabolic panel        08/29/23 1211    CBC with Differential/Platelet        08/29/23 1211     TSH Rfx on Abnormal to Free T4        08/29/23 1211    B12        08/29/23 1211          Diagnoses and all orders for this visit: Lightheadedness -     Comprehensive metabolic panel -     CBC with Differential/Platelet -     TSH Rfx on Abnormal to Free T4 -     B12  Recommended follow-up: as needed      Subjective   47 y.o. male who has COVID-19 and Cough on their problem list.. Their main reasons/main concerns/chief complaints for today's office visit are Light headed (Happened this morning.)   ------------------------------------------------------------------------------------------------------------------------ AI-Extracted: Discussed the use of AI scribe software for clinical note transcription with the patient, who gave verbal consent to proceed.  The patient is a 47 year old male with no significant past medical history who presented with an episode of lightheadedness, cold flashes, and tingling in the extremities that occurred upon waking up and urinating in the morning. The symptoms were similar to a previous anxiety attack experienced years ago, but the patient did not identify any preceding anxiety or stress. The episode began after urination, with the patient describing a sudden onset of lightheadedness and a cold flash that overcame him, prompting him to lie down. The patient reported a sensation of potential fainting, palpitations, and cold sweats, but denied any urinary incontinence. The episode lasted approximately 30 minutes, after which the  patient felt better but fatigued. The patient denied any recent changes in medication, appetite, or bowel habits. There was no associated chest pain or shortness of breath. The patient did note a mild lower back pain, which he attributed to a recent workout. The patient denied any significant stressors, although he mentioned his son recently going to college. The patient denied any subsequent episodes of diarrhea or changes in  urination. The patient's bowel movement that morning was unremarkable, with no straining or blood noted.      Note that patient  has a past medical history of Allergy and COVID-19 (07/09/2020).  Problem list overviews that were updated at today's visit: No problems updated. Current Outpatient Medications on File Prior to Visit  Medication Sig   Ibuprofen (ADVIL) 200 MG CAPS Take 400 mg by mouth as needed.   No current facility-administered medications on file prior to visit.  There are no discontinued medications.   Objective   Physical Exam  BP 126/88 (BP Location: Left Arm)   Pulse 65   Temp 98.2 F (36.8 C) (Temporal)   Ht 5' 10.5" (1.791 m)   Wt 165 lb 6.4 oz (75 kg)   SpO2 98%   BMI 23.40 kg/m  Wt Readings from Last 10 Encounters:  08/29/23 165 lb 6.4 oz (75 kg)  07/08/23 164 lb 8 oz (74.6 kg)  06/26/23 167 lb (75.8 kg)  12/08/21 167 lb 4 oz (75.9 kg)  07/29/20 157 lb 4 oz (71.3 kg)  07/12/20 165 lb (74.8 kg)  07/09/20 164 lb (74.4 kg)  07/07/20 163 lb (73.9 kg)   Vital signs reviewed.  Nursing notes reviewed. Weight trend reviewed. Abnormalities and Problem-Specific physical exam findings:    General Appearance:  No acute distress appreciable.   Well-groomed, healthy-appearing male.  Well proportioned with no abnormal fat distribution.  Good muscle tone. Pulmonary:  Normal work of breathing at rest, no respiratory distress apparent. SpO2: 98 %  Musculoskeletal: All extremities are intact.  Neurological:  Awake, alert, oriented, and engaged.  No obvious focal neurological deficits or cognitive impairments.  Sensorium seems unclouded.   Speech is clear and coherent with logical content. Psychiatric:  Appropriate mood, pleasant and cooperative demeanor, thoughtful and engaged during the exam  Results   DIAGNOSTIC EKG: Normal        No results found for any visits on 08/29/23.  No visits with results within 1 Year(s) from this visit.  Latest known visit with  results is:  Admission on 07/12/2020, Discharged on 07/12/2020  Component Date Value   Lipase 07/12/2020 52 (H)    Sodium 07/12/2020 140    Potassium 07/12/2020 3.8    Chloride 07/12/2020 105    CO2 07/12/2020 25    Glucose, Bld 07/12/2020 110 (H)    BUN 07/12/2020 13    Creatinine, Ser 07/12/2020 1.12    Calcium 07/12/2020 8.8 (L)    Total Protein 07/12/2020 6.6    Albumin 07/12/2020 3.5    AST 07/12/2020 46 (H)    ALT 07/12/2020 41    Alkaline Phosphatase 07/12/2020 43    Total Bilirubin 07/12/2020 0.8    GFR calc non Af Amer 07/12/2020 >60    GFR calc Af Amer 07/12/2020 >60    Anion gap 07/12/2020 10    WBC 07/12/2020 7.4    RBC 07/12/2020 4.93    Hemoglobin 07/12/2020 14.7    HCT 07/12/2020 45.1    MCV 07/12/2020 91.5    MCH 07/12/2020 29.8    MCHC  07/12/2020 32.6    RDW 07/12/2020 13.2    Platelets 07/12/2020 187    nRBC 07/12/2020 0.0    Color, Urine 07/12/2020 YELLOW    APPearance 07/12/2020 CLEAR    Specific Gravity, Urine 07/12/2020 1.013    pH 07/12/2020 7.0    Glucose, UA 07/12/2020 NEGATIVE    Hgb urine dipstick 07/12/2020 SMALL (A)    Bilirubin Urine 07/12/2020 NEGATIVE    Ketones, ur 07/12/2020 NEGATIVE    Protein, ur 07/12/2020 30 (A)    Nitrite 07/12/2020 NEGATIVE    Leukocytes,Ua 07/12/2020 NEGATIVE    RBC / HPF 07/12/2020 0-5    WBC, UA 07/12/2020 0-5    Bacteria, UA 07/12/2020 NONE SEEN    Squamous Epithelial / HPF 07/12/2020 0-5    No image results found.   No results found.  No results found.     Additional Info: This encounter employed real-time, collaborative documentation. The patient actively reviewed and updated their medical record on a shared screen, ensuring transparency and facilitating joint problem-solving for the problem list, overview, and plan. This approach promotes accurate, informed care. The treatment plan was discussed and reviewed in detail, including medication safety, potential side effects, and all patient questions. We  confirmed understanding and comfort with the plan. Follow-up instructions were established, including contacting the office for any concerns, returning if symptoms worsen, persist, or new symptoms develop, and precautions for potential emergency department visits.

## 2023-08-30 LAB — CBC WITH DIFFERENTIAL/PLATELET
Basophils Absolute: 0 10*3/uL (ref 0.0–0.1)
Basophils Relative: 0.7 % (ref 0.0–3.0)
Eosinophils Absolute: 0.1 10*3/uL (ref 0.0–0.7)
Eosinophils Relative: 1 % (ref 0.0–5.0)
HCT: 48.9 % (ref 39.0–52.0)
Hemoglobin: 15.9 g/dL (ref 13.0–17.0)
Lymphocytes Relative: 17.7 % (ref 12.0–46.0)
Lymphs Abs: 1.2 10*3/uL (ref 0.7–4.0)
MCHC: 32.4 g/dL (ref 30.0–36.0)
MCV: 92 fL (ref 78.0–100.0)
Monocytes Absolute: 0.6 10*3/uL (ref 0.1–1.0)
Monocytes Relative: 8.6 % (ref 3.0–12.0)
Neutro Abs: 5 10*3/uL (ref 1.4–7.7)
Neutrophils Relative %: 72 % (ref 43.0–77.0)
Platelets: 242 10*3/uL (ref 150.0–400.0)
RBC: 5.32 Mil/uL (ref 4.22–5.81)
RDW: 13.3 % (ref 11.5–15.5)
WBC: 6.9 10*3/uL (ref 4.0–10.5)

## 2023-08-30 LAB — TSH RFX ON ABNORMAL TO FREE T4: TSH: 1.01 u[IU]/mL (ref 0.450–4.500)

## 2023-08-31 ENCOUNTER — Encounter: Payer: Self-pay | Admitting: Physician Assistant

## 2023-09-01 ENCOUNTER — Ambulatory Visit: Payer: Managed Care, Other (non HMO) | Attending: Physician Assistant

## 2023-09-01 ENCOUNTER — Encounter: Payer: Self-pay | Admitting: Physician Assistant

## 2023-09-01 ENCOUNTER — Ambulatory Visit (INDEPENDENT_AMBULATORY_CARE_PROVIDER_SITE_OTHER): Payer: Managed Care, Other (non HMO) | Admitting: Physician Assistant

## 2023-09-01 VITALS — BP 138/90 | HR 66 | Temp 97.8°F | Ht 70.5 in | Wt 166.5 lb

## 2023-09-01 DIAGNOSIS — M545 Low back pain, unspecified: Secondary | ICD-10-CM

## 2023-09-01 DIAGNOSIS — Z8249 Family history of ischemic heart disease and other diseases of the circulatory system: Secondary | ICD-10-CM

## 2023-09-01 DIAGNOSIS — R030 Elevated blood-pressure reading, without diagnosis of hypertension: Secondary | ICD-10-CM | POA: Diagnosis not present

## 2023-09-01 DIAGNOSIS — R42 Dizziness and giddiness: Secondary | ICD-10-CM

## 2023-09-01 DIAGNOSIS — Z1211 Encounter for screening for malignant neoplasm of colon: Secondary | ICD-10-CM

## 2023-09-01 LAB — POCT URINALYSIS DIPSTICK
Bilirubin, UA: NEGATIVE
Blood, UA: NEGATIVE
Glucose, UA: NEGATIVE
Ketones, UA: NEGATIVE
Leukocytes, UA: NEGATIVE
Nitrite, UA: NEGATIVE
Protein, UA: NEGATIVE
Spec Grav, UA: 1.01 (ref 1.010–1.025)
Urobilinogen, UA: 0.2 U/dL
pH, UA: 7 (ref 5.0–8.0)

## 2023-09-01 MED ORDER — ALPRAZOLAM 0.25 MG PO TABS
0.1250 mg | ORAL_TABLET | Freq: Two times a day (BID) | ORAL | 0 refills | Status: DC | PRN
Start: 2023-09-01 — End: 2024-01-06

## 2023-09-01 NOTE — Progress Notes (Signed)
Jonathan Elliott is a 47 y.o. male here for a follow-up of a pre-existing problem.  History of Present Illness:   Chief Complaint  Patient presents with   Back Pain    Pt c/o lower back pain, left side, dull shooting pain off and on.   vasovagal episode    Pt said he had another episode on Wed, all of sudden he feels his body start to tingle, feels flushed and lightheaded almost like he is having panic attacks, Went to clinic at work Bp was 142/90, pulse 88. Bp was 122/83   HPI - here with wife  Lower Back Pain: Discussed with Glenetta Hew, MD on 10/21, noting it may be related to a recent workout he'd done.  He reports that he works out everyday. Describes pain as intermittent dull, shooting, and left-sided. States he doesn't normally use intervention to manage pain, occasionally uses Advil.   Expressed that he's been concerned about cancer - they note that a lot of their younger church members have been diagnosed with cancers, contributing to his anxiety. Notes he's been avoiding his colonoscopy, but is planning on doing it. Wife requested referral.  Denies unintentional weight loss, night sweats, blood in stool, or unusual abdominal pain.   Vasovagal Episode: Seen earlier this week by Dr. Jon Billings on 10/21 for the same symptoms.   Describes waking up Monday morning, around 6:30 AM, and while going to the bathroom he felt mild lightheadedness. Afterwards experienced a cold flash so he laid on bed for a while before his appointment with Dr. Jon Billings. Experineced constant fatigue for the rest of the day.  Wife adds that on Sunday their only son, whose in his 1st year of college, drove to visit them and after he left patient was tracking him on [employer] until he'd made it back -- this was admittedly anxiety-provoking for patient.  Another episode Wednesday while he was in a work meeting while he was hungry, and started to feel lightheaded.  Notes that he usually eats every 2 hours from  when he wakes up, otherwise he will experience mild hypoglycemic symptoms, but those are distinquishable from his current symptoms. He was starting to feel mild symptoms in-office, improved after eating a bananas.    To distract himself he did a slight workout, and followed-up with his in-office clinic where they took his blood pressure and heart rate: 1st - BP 142/90 & HR 88 2nd - BP 142/88 & HR 84 3rd - BP 136/84 & HR 80 Later that day he experienced symptoms again however they were much more mild.  Reports he has experienced one major anxiety attack once with similar symptoms: cold flash and tingling, was after his father's passing. He has never taken any medicaton to manage anxiety before.  Denies recent Covid contraction.  Endorses mild headache currently, and constant anxiety since Monday due to unknown etiology.  Wife notes he had been dealing with anxiety recently due to [facility] as they have lots of family in the affected areas, she also believes he may be dealing with constant underlying anxiety due to fhx of heart disease and MIs.  States that he had a cardiac work-up shortly after his father died - which resulted normal. This was probably 15 years ago.  They do have a blood pressure monitor at home for wife's hypertension; he hasn't used this at all.   Describes diet: no alcohol, avoids salt in meals due to wife's hypertension, about 34-60 oz of water daily, and 1  6-cup coffee pot in the morning.   Past Medical History:  Diagnosis Date   Allergy    COVID-19 07/09/2020    Social History   Tobacco Use   Smoking status: Never   Smokeless tobacco: Never  Vaping Use   Vaping status: Never Used  Substance Use Topics   Alcohol use: Never   Drug use: Never   Past Surgical History:  Procedure Laterality Date   APPENDECTOMY  2002   Family History  Problem Relation Age of Onset   Heart disease Father 18   Heart attack Paternal Grandmother    Allergies  Allergen  Reactions   Penicillins    Current Medications:   Current Outpatient Medications:    ALPRAZolam (XANAX) 0.25 MG tablet, Take 0.5 tablets (0.125 mg total) by mouth 2 (two) times daily as needed for anxiety., Disp: 20 tablet, Rfl: 0   Ibuprofen (ADVIL) 200 MG CAPS, Take 400 mg by mouth as needed., Disp: , Rfl:    Review of Systems:   ROS See pertinent positives and negatives as per the HPI.  Vitals:   Vitals:   09/01/23 0949 09/01/23 1046  BP: (!) 142/90 (!) 138/90  Pulse: 66   Temp: 97.8 F (36.6 C)   TempSrc: Temporal   SpO2: 96%   Weight: 166 lb 8 oz (75.5 kg)   Height: 5' 10.5" (1.791 m)      Body mass index is 23.55 kg/m.  Physical Exam:   Physical Exam Vitals and nursing note reviewed.  Constitutional:      General: He is not in acute distress.    Appearance: He is well-developed. He is not ill-appearing or toxic-appearing.  Cardiovascular:     Rate and Rhythm: Normal rate and regular rhythm.     Pulses: Normal pulses.     Heart sounds: Normal heart sounds, S1 normal and S2 normal.  Pulmonary:     Effort: Pulmonary effort is normal.     Breath sounds: Normal breath sounds.  Musculoskeletal:     Comments: No decreased ROM 2/2 pain with flexion/extension, lateral side bends, or rotation. No reproducible tenderness with deep palpation to bilateral paraspinal muscles. No bony tenderness. No evidence of erythema, rash or ecchymosis. Negative STLR bilaterally.   Skin:    General: Skin is warm and dry.  Neurological:     Mental Status: He is alert.     GCS: GCS eye subscore is 4. GCS verbal subscore is 5. GCS motor subscore is 6.  Psychiatric:        Speech: Speech normal.        Behavior: Behavior normal. Behavior is cooperative.    Results for orders placed or performed in visit on 09/01/23  POCT urinalysis dipstick  Result Value Ref Range   Color, UA yellow    Clarity, UA clear    Glucose, UA Negative Negative   Bilirubin, UA Negative    Ketones, UA  Negative    Spec Grav, UA 1.010 1.010 - 1.025   Blood, UA Negative    pH, UA 7.0 5.0 - 8.0   Protein, UA Negative Negative   Urobilinogen, UA 0.2 0.2 or 1.0 E.U./dL   Nitrite, UA Negative    Leukocytes, UA Negative Negative   Appearance     Odor      Assessment and Plan:   Lightheadedness Suspect likely vasovagal symptom(s) with combination of anxiety Recent blood work reviewed - wnl EKG tracing is personally reviewed.  EKG notes NSR.  No acute changes.  After shared decision making with patient and wife will order the following: --Cardiology referral --Zio patch --CT calcium score We also discussed monitoring blood pressure 1-2 times per week, sending [place] MyChart message in 2-4 weeks with averages so we can advise further Consistent nutrition every 2 hours with cho + pro in each snack/meal Consider adding electrolytes throughout the day Short script of as needed xanax given to help with possible underlying anxiety Avoid strenuous exercise until seen by cardiology  Left-sided low back pain without sciatica, unspecified chronicity Unclear etiology UA normal Symptom(s) not reproducible or present at the moment  Continue to monitor Refer to sports medicine if continues  Elevated blood pressure reading Above goal today No evidence of end-organ damage on my exam Recommend patient monitor home blood pressure at least a few times weekly If home monitoring shows consistent elevation, or any symptom(s) develop, recommend reach out to [place] for further advice on next steps  I,Emily Lagle,acting as a scribe for [employer], PA.,have documented all relevant documentation on the behalf of Jarold Motto, PA,as directed by  Jarold Motto, PA while in the presence of Jarold Motto, [place].  I, Jarold Motto, [place], have reviewed all documentation for this visit. The documentation on 09/01/23 for the exam, diagnosis, procedures, and orders are all accurate and complete.  I spent a total  of 41 minutes on this visit, today 09/01/23, which included reviewing previous notes from Dr Jon Billings on 08/29/23, ordering tests, discussing plan of care with patient and using shared-decision making on next steps, refilling medications, and documenting the findings in the note.   Jarold Motto, PA-C

## 2023-09-01 NOTE — Patient Instructions (Signed)
It was great to see you!  -We need to place referral for colonoscopy -Cardiology referral -Zio patch for 14 days -CT calcium score order placed -EKG today -xanax for as needed  Take care,  Jarold Motto PA-C

## 2023-09-01 NOTE — Progress Notes (Unsigned)
EP to read

## 2023-09-22 DIAGNOSIS — R42 Dizziness and giddiness: Secondary | ICD-10-CM

## 2023-09-22 DIAGNOSIS — R030 Elevated blood-pressure reading, without diagnosis of hypertension: Secondary | ICD-10-CM | POA: Diagnosis not present

## 2023-09-26 ENCOUNTER — Other Ambulatory Visit: Payer: Self-pay | Admitting: Psychiatry

## 2023-09-26 DIAGNOSIS — F410 Panic disorder [episodic paroxysmal anxiety] without agoraphobia: Secondary | ICD-10-CM | POA: Insufficient documentation

## 2023-09-26 DIAGNOSIS — N503 Cyst of epididymis: Secondary | ICD-10-CM

## 2023-09-29 ENCOUNTER — Ambulatory Visit
Admission: RE | Admit: 2023-09-29 | Discharge: 2023-09-29 | Disposition: A | Discharge status: Home / Self Care | Payer: Managed Care, Other (non HMO) | Source: Ambulatory Visit | Attending: Psychiatry | Admitting: Psychiatry

## 2023-09-29 DIAGNOSIS — N503 Cyst of epididymis: Secondary | ICD-10-CM

## 2023-09-30 ENCOUNTER — Ambulatory Visit (HOSPITAL_BASED_OUTPATIENT_CLINIC_OR_DEPARTMENT_OTHER)
Admission: RE | Admit: 2023-09-30 | Discharge: 2023-09-30 | Disposition: A | Discharge status: Home / Self Care | Payer: Managed Care, Other (non HMO) | Source: Ambulatory Visit | Attending: Physician Assistant | Admitting: Physician Assistant

## 2023-09-30 DIAGNOSIS — Z8249 Family history of ischemic heart disease and other diseases of the circulatory system: Secondary | ICD-10-CM | POA: Insufficient documentation

## 2023-10-04 ENCOUNTER — Encounter: Payer: Self-pay | Admitting: Physician Assistant

## 2023-10-04 ENCOUNTER — Other Ambulatory Visit: Payer: Self-pay | Admitting: Physician Assistant

## 2023-10-04 DIAGNOSIS — R42 Dizziness and giddiness: Secondary | ICD-10-CM

## 2023-10-04 DIAGNOSIS — M545 Low back pain, unspecified: Secondary | ICD-10-CM

## 2023-10-18 ENCOUNTER — Ambulatory Visit (HOSPITAL_COMMUNITY): Payer: Managed Care, Other (non HMO) | Attending: Cardiology

## 2023-10-18 DIAGNOSIS — R42 Dizziness and giddiness: Secondary | ICD-10-CM | POA: Diagnosis present

## 2023-10-18 LAB — ECHOCARDIOGRAM COMPLETE
Area-P 1/2: 2.3 cm2
S' Lateral: 2.9 cm

## 2023-10-19 ENCOUNTER — Encounter: Payer: Self-pay | Admitting: Cardiovascular Disease

## 2023-10-19 ENCOUNTER — Ambulatory Visit: Payer: Managed Care, Other (non HMO) | Attending: Cardiovascular Disease | Admitting: Cardiovascular Disease

## 2023-10-19 VITALS — BP 122/88 | HR 87 | Ht 71.0 in | Wt 167.8 lb

## 2023-10-19 DIAGNOSIS — Z8249 Family history of ischemic heart disease and other diseases of the circulatory system: Secondary | ICD-10-CM

## 2023-10-19 DIAGNOSIS — R42 Dizziness and giddiness: Secondary | ICD-10-CM | POA: Diagnosis not present

## 2023-10-19 NOTE — Assessment & Plan Note (Signed)
Patient was referred for episodes of dizziness.  In hindsight, these sound like anxiety episodes.  Since been gaining low-dose Xanax his symptoms have markedly improved.  He did have a complete workup by Jarold Motto, PA-C including a coronary calcium score which was 0.  A 2D echo was essentially normal.  An event monitor which showed 4 short atrial runs.  I have reassured him that these episodes are benign.  No further workup is necessary.

## 2023-10-19 NOTE — Assessment & Plan Note (Signed)
Father died of a myocardial infarction at age 49

## 2023-10-19 NOTE — Progress Notes (Signed)
10/19/2023 Jonathan Elliott   [birth year]  [id number]  Primary Physician Jarold Motto, PA Primary Cardiologist: Runell Gess MD Nicholes Calamity, [place]  HPI:  Jonathan Elliott is a 47 y.o. 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  He was referred by Jarold Motto, PA-C because of episodes of dizziness.  His only risk factor is family history of a father who died of a myocardial infarction at age 68.  He is never had a heart attack or stroke.  He exercises on a daily basis and has not heart healthy diet.  He has had anxiety episodes for last 15 years since his father passed away.  These are manifested by tingling in his hands with lightheadedness.  His referring provider obtain a coronary calcium score which was 0.  2D echo which was entirely normal.  An event monitor that showed 4 short atrial runs.  Since beginning the Xanax his episodes have markedly improved.    Current Meds  Medication Sig   ALPRAZolam (XANAX) 0.25 MG tablet Take 0.5 tablets (0.125 mg total) by mouth 2 (two) times daily as needed for anxiety.   Ibuprofen (ADVIL) 200 MG CAPS Take 400 mg by mouth as needed.     Allergies  Allergen Reactions   Penicillins     Social History   Socioeconomic History   Marital status: Married    Spouse name: Not on file   Number of children: Not on file   Years of education: Not on file   Highest education level: Master's degree (e.g., MA, MS, MEng, MEd, MSW, MBA)  Occupational History   Not on file  Tobacco Use   Smoking status: Never   Smokeless tobacco: Never  Vaping Use   Vaping status: Never Used  Substance and Sexual Activity   Alcohol use: Never   Drug use: Never   Sexual activity: Yes  Other Topics Concern   Not on file  Social History Narrative   Emergency planning/management officer for [employer]   Married   75 yo son   Social Determinants of Health   Financial Resource Strain: Low Risk   (09/20/2023)   Received from [employer] Strain    In the past 12 months has the electric, gas, oil, or water company threatened to shut off services in your home?: No    Do problems getting child care make it difficult for you to work or study?: No    Do you have a high school degree?: Yes    Do you have a job?: Yes    How often does this describe you? I don't have enough money to pay my bills.: Never  Food Insecurity: Low Risk  (10/11/2023)   Received from [facility]   Food Insecurity    Worried about Food: 1    Food Lasting: 1  Recent Concern: Food Insecurity - Medium Risk (09/20/2023)   Received from [facility]   Food Insecurity    Worried about Food: 1    Food Lasting: 1  Transportation Needs: Low Risk  (09/20/2023)   Received from [facility]   Transportation Needs    In the past 12 months, has lack of transportation kept you from medical appointments, meetings, work, or from getting things needed for daily living? (check all that apply): No  Physical Activity: Low Risk  (09/20/2023)   Received from [facility]   Physical  Activity    How often do you engage in moderate physical activity for 30 minutes or more?: Everyday    How often do you engage in vigorous physical activity for 20 minutes or more?: Everyday    How many hours per day do you spend sitting?: 4 to 6 hours  Stress: Low Risk  (09/20/2023)   Received from [facility]   Stress    Stress in your Life: 2    Dealing with Stress: 1  Social Connections: Socially Integrated (07/07/2023)   Social Connection and Isolation Panel [NHANES]    Frequency of Communication with Friends and Family: More than three times a week    Frequency of Social Gatherings with Friends and Family: More than three times a week    Attends Religious Services: More than 4 times per year    Active Member of [employer] or Organizations: Yes    Attends Banker Meetings: More than 4 times per year     Marital Status: Married  Catering manager Violence: Unknown (02/11/2022)   Received from [facility]   HITS    Physically Hurt: Not on file    Insult or Talk Down To: Not on file    Threaten Physical Harm: Not on file    Scream or Curse: Not on file     Review of Systems: General: negative for chills, fever, night sweats or weight changes.  Cardiovascular: negative for chest pain, dyspnea on exertion, edema, orthopnea, palpitations, paroxysmal nocturnal dyspnea or shortness of breath Dermatological: negative for rash Respiratory: negative for cough or wheezing Urologic: negative for hematuria Abdominal: negative for nausea, vomiting, diarrhea, bright red blood per rectum, melena, or hematemesis Neurologic: negative for visual changes, syncope, or dizziness All other systems reviewed and are otherwise negative except as noted above.    Blood pressure 122/88, pulse 87, height 5\' 11"  (1.803 m), weight 167 lb 12.8 oz (76.1 kg), SpO2 94%.  General appearance: alert and no distress Neck: no adenopathy, no carotid bruit, no JVD, supple, symmetrical, trachea midline, and thyroid not enlarged, symmetric, no tenderness/mass/nodules Lungs: clear to auscultation bilaterally Heart: regular rate and rhythm, S1, S2 normal, no murmur, click, rub or gallop Extremities: extremities normal, atraumatic, no cyanosis or edema Pulses: 2+ and symmetric Skin: Skin color, texture, turgor normal. No rashes or lesions Neurologic: Grossly normal  EKG not performed today      ASSESSMENT AND PLAN:   Family history of heart disease Father died of a myocardial infarction at age 24  Dizziness Patient was referred for episodes of dizziness.  In hindsight, these sound like anxiety episodes.  Since been gaining low-dose Xanax his symptoms have markedly improved.  He did have a complete workup by Jarold Motto, PA-C including a coronary calcium score which was 0.  A 2D echo was essentially normal.  An event  monitor which showed 4 short atrial runs.  I have reassured him that these episodes are benign.  No further workup is necessary.     Runell Gess MD FACP,FACC,FAHA, [facility] 10/19/2023 2:00 PM

## 2023-10-19 NOTE — Patient Instructions (Signed)
Medication Instructions:  Your physician recommends that you continue on your current medications as directed. Please refer to the Current Medication list given to you today.  *If you need a refill on your cardiac medications before your next appointment, please call your pharmacy*   Follow-Up: At [facility], you and your health needs are our priority.  As part of our continuing mission to provide you with exceptional heart care, we have created designated Provider Care Teams.  These Care Teams include your primary Cardiologist (physician) and Advanced Practice Providers (APPs -  Physician Assistants and Nurse Practitioners) who all work together to provide you with the care you need, when you need it.  We recommend signing up for the patient portal called "MyChart".  Sign up information is provided on this After Visit Summary.  MyChart is used to connect with patients for Virtual Visits (Telemedicine).  Patients are able to view lab/test results, encounter notes, upcoming appointments, etc.  Non-urgent messages can be sent to your provider as well.   To learn more about what you can do with MyChart, go to [email or web address].    Your next appointment:   We will see you on an as needed basis.  Provider:   Jonathan Berry, MD  

## 2023-11-08 ENCOUNTER — Ambulatory Visit: Payer: Managed Care, Other (non HMO) | Admitting: Family Medicine

## 2024-01-06 ENCOUNTER — Ambulatory Visit: Payer: Managed Care, Other (non HMO)

## 2024-01-06 ENCOUNTER — Encounter: Payer: Self-pay | Admitting: Family Medicine

## 2024-01-06 ENCOUNTER — Ambulatory Visit (INDEPENDENT_AMBULATORY_CARE_PROVIDER_SITE_OTHER): Payer: Managed Care, Other (non HMO) | Admitting: Family Medicine

## 2024-01-06 VITALS — BP 132/86 | HR 82 | Ht 71.0 in | Wt 167.0 lb

## 2024-01-06 DIAGNOSIS — M545 Low back pain, unspecified: Secondary | ICD-10-CM

## 2024-01-06 DIAGNOSIS — M2550 Pain in unspecified joint: Secondary | ICD-10-CM | POA: Diagnosis not present

## 2024-01-06 DIAGNOSIS — G8929 Other chronic pain: Secondary | ICD-10-CM

## 2024-01-06 LAB — SEDIMENTATION RATE: Sed Rate: 3 mm/h (ref 0–15)

## 2024-01-06 NOTE — Progress Notes (Signed)
 I, Stevenson Clinch, CMA acting as a scribe for Clementeen Graham, MD.  Jonathan Elliott is a 48 y.o. male who presents to [facility] at [facility] today for low back pain x 4 months. Pt locates pain to left side lower back. Works out and stretches daily. Notes popping in the lower back when doing leg lifts. Notes increased stiffness in the mornings.   Radiating pain: no LE numbness/tingling: rarely, L LE LE weakness: no Aggravates: leg lifts, prolonged inactivity  Treatments tried: Aleve, heat/ice  Pertinent review of systems: No fevers or chills.  Positive for morning stiffness and pain.  Relevant historical information: Otherwise healthy No history of psoriasis. Positive family history for rheumatoid arthritis  Exam:  BP 132/86   Pulse 82   Ht 5\' 11"  (1.803 m)   Wt 167 lb (75.8 kg)   SpO2 98%   BMI 23.29 kg/m  General: Well Developed, well nourished, and in no acute distress.   MSK: L-spine normal appearing Nontender palpation midline.  Tender palpation chest at or lateral to the left paraspinal musculature around the L4 region. Not particular tender at SI joints. Normal lumbar motion. Lower extremity strength is intact. Negative SI joint rock test or compression test.  Skin: No psoriasis rashes no Groton's papules.  No nail splitting or pitting  Lab and Radiology Results  X-ray images L-spine and pelvis obtained today personally and independently interpreted.  L-spine: No acute fractures.  No severe degenerative changes.  No spondylolisthesis.  Pelvis: No obvious sacroiliitis.  No acute fractures.  Await formal radiology review    Assessment and Plan: 48 y.o. male with chronic right-sided low back pain associate with morning stiffness.  Differential diagnosis includes muscle dysfunction especially of the quadratus lumborum or perispinal multifidus muscles.  He is a great candidate for physical therapy.  Plan on PT referral.  Recheck in 6  weeks.  Differential does also include sacroiliitis or ankylosing spondylitis or associated inflammatory arthritis conditions.  Plan on limited rheumatologic workup listed below.  If positive will refer to rheumatology.   PDMP not reviewed this encounter. Orders Placed This Encounter  Procedures   DG Lumbar Spine 2-3 Views    Standing Status:   Future    Number of Occurrences:   1    Expiration Date:   02/03/2024    Reason for Exam (SYMPTOM  OR DIAGNOSIS REQUIRED):   low back pain    Preferred imaging location?:   [facility]   DG Pelvis 1-2 Views    Standing Status:   Future    Number of Occurrences:   1    Expiration Date:   01/05/2025    Reason for Exam (SYMPTOM  OR DIAGNOSIS REQUIRED):   low back pain    Preferred imaging location?:   [facility]   ANA    Standing Status:   Future    Number of Occurrences:   1    Expiration Date:   01/05/2025   Cyclic citrul peptide antibody, IgG    Standing Status:   Future    Number of Occurrences:   1    Expiration Date:   01/05/2025   HLA-B27 antigen    Polyarthalgia    Standing Status:   Future    Number of Occurrences:   1    Expiration Date:   01/05/2025   Rheumatoid factor    Standing Status:   Future    Number of Occurrences:   1  Expiration Date:   01/05/2025   Sedimentation rate    Standing Status:   Future    Number of Occurrences:   1    Expiration Date:   01/05/2025   Ambulatory referral to Physical Therapy    Referral Priority:   Routine    Referral Type:   Physical Medicine    Referral Reason:   Specialty Services Required    Requested Specialty:   Physical Therapy    Number of Visits Requested:   1   No orders of the defined types were placed in this encounter.    Discussed warning signs or symptoms. Please see discharge instructions. Patient expresses understanding.   The above documentation has been reviewed and is accurate and complete Clementeen Graham, M.D.

## 2024-01-06 NOTE — Patient Instructions (Addendum)
 Thank you for coming in today.   Please get an Xray today before you leave   Please get labs today before you leave   I've referred you to Physical Therapy.  Let us know if you don't hear from them in one week.   Check back in 6 weeks

## 2024-01-10 ENCOUNTER — Encounter: Payer: Self-pay | Admitting: Family Medicine

## 2024-01-10 LAB — HLA-B27 ANTIGEN: HLA-B27 Antigen: NEGATIVE

## 2024-01-10 LAB — CYCLIC CITRUL PEPTIDE ANTIBODY, IGG: Cyclic Citrullin Peptide Ab: <16 U

## 2024-01-10 LAB — ANTI-NUCLEAR AB-TITER (ANA TITER): ANA Titer 1: 1:80 {titer} — ABNORMAL HIGH

## 2024-01-10 LAB — ANA: Anti Nuclear Antibody (ANA): POSITIVE — AB

## 2024-01-10 LAB — RHEUMATOID FACTOR: Rheumatoid fact SerPl-aCnc: <10 [IU]/mL (ref <14)

## 2024-01-10 NOTE — Progress Notes (Signed)
 ANA lab was mildly positive with a titer of 1: 80.  In isolation I do not think this means much.  I recommend that we proceed with physical therapy trial and if not better consider a rheumatology assessment.  The laboratory test that I was the most worried about HLA-B27 was negative thankfully.

## 2024-01-11 ENCOUNTER — Ambulatory Visit: Payer: Managed Care, Other (non HMO) | Admitting: Physician Assistant

## 2024-01-17 ENCOUNTER — Other Ambulatory Visit: Payer: Self-pay

## 2024-01-17 ENCOUNTER — Ambulatory Visit (INDEPENDENT_AMBULATORY_CARE_PROVIDER_SITE_OTHER): Admitting: Family Medicine

## 2024-01-17 ENCOUNTER — Encounter: Payer: Self-pay | Admitting: Family Medicine

## 2024-01-17 VITALS — BP 120/82 | HR 77 | Ht 71.0 in | Wt 171.0 lb

## 2024-01-17 DIAGNOSIS — M79644 Pain in right finger(s): Secondary | ICD-10-CM | POA: Diagnosis not present

## 2024-01-17 DIAGNOSIS — M6740 Ganglion, unspecified site: Secondary | ICD-10-CM | POA: Diagnosis not present

## 2024-01-17 NOTE — Progress Notes (Signed)
   I, Stevenson Clinch, CMA acting as a scribe for Clementeen Graham, MD.  Jonathan Elliott is a 48 y.o. male who presents to [facility] at [facility] today for a bump on his finger. Pt was previously seen by Dr. Denyse Amass on 01/06/24 for LBP.  Today, pt presents w/ a bump on his finger right 3rd finger. Pt locates nodule to MCP. Typically non painful but can press on the nerve at times. Sx are not impacting daily function. The nodule has not changed in size over time.   His back is feeling better with change in activity.  Pertinent review of systems: No fevers or chills  Relevant historical information: Otherwise healthy   Exam:  BP 120/82   Pulse 77   Ht 5\' 11"  (1.803 m)   Wt 171 lb (77.6 kg)   SpO2 96%   BMI 23.85 kg/m  General: Well Developed, well nourished, and in no acute distress.   MSK: Right hand small palpable nodule present at palmar third MCP.  Nontender normal hand motion.  Grip strength is intact no triggering present.    Lab and Radiology Results  Diagnostic Limited MSK Ultrasound of: Hand palmar third MCP Ending sheath is intact without tear.  Small ganglion cyst is present superficial to the tendon sheath measuring 2 x 3 x 4 mm Impression: Right flexor tendon ganglion cyst associated with the third digit     Assessment and Plan: 48 y.o. male with small flexor tendon ganglion cyst right third MCP.  His symptoms are not bothersome enough that aspiration and injection makes sense today.  If it becomes annoying or bothersome happy to proceed with aspiration injection anytime.  For now watchful waiting.   PDMP not reviewed this encounter. Orders Placed This Encounter  Procedures   [place] LIMITED JOINT SPACE STRUCTURES UP RIGHT(NO LINKED CHARGES)    Reason for Exam (SYMPTOM  OR DIAGNOSIS REQUIRED):   R 3rd finger pain    Preferred imaging location?:   [facility]   No orders of the defined types were placed in this  encounter.    Discussed warning signs or symptoms. Please see discharge instructions. Patient expresses understanding.   The above documentation has been reviewed and is accurate and complete Clementeen Graham, M.D.

## 2024-01-17 NOTE — Patient Instructions (Addendum)
 Thank you for coming in today.  We can aspirate the ganglion cyst whenever makes sense.  Let us know.

## 2024-01-26 ENCOUNTER — Encounter: Payer: Self-pay | Admitting: Family Medicine

## 2024-01-26 NOTE — Progress Notes (Signed)
Pelvis x-ray looks normal to radiology

## 2024-01-26 NOTE — Progress Notes (Signed)
 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

## 2024-02-08 ENCOUNTER — Ambulatory Visit: Payer: Managed Care, Other (non HMO) | Admitting: Family Medicine

## 2024-11-14 ENCOUNTER — Ambulatory Visit: Payer: Self-pay

## 2024-11-14 ENCOUNTER — Ambulatory Visit: Admitting: Internal Medicine

## 2024-11-14 VITALS — BP 110/82 | HR 76 | Temp 99.0°F | Resp 14 | Ht 71.0 in | Wt 168.2 lb

## 2024-11-14 DIAGNOSIS — J101 Influenza due to other identified influenza virus with other respiratory manifestations: Secondary | ICD-10-CM | POA: Diagnosis not present

## 2024-11-14 DIAGNOSIS — J209 Acute bronchitis, unspecified: Secondary | ICD-10-CM

## 2024-11-14 DIAGNOSIS — J440 Chronic obstructive pulmonary disease with acute lower respiratory infection: Secondary | ICD-10-CM | POA: Diagnosis not present

## 2024-11-14 DIAGNOSIS — J324 Chronic pansinusitis: Secondary | ICD-10-CM | POA: Diagnosis not present

## 2024-11-14 LAB — POCT INFLUENZA A/B
Influenza A, POC: NEGATIVE
Influenza B, POC: NEGATIVE

## 2024-11-14 MED ORDER — LEVOCETIRIZINE DIHYDROCHLORIDE 5 MG PO TABS: 5.0000 mg | ORAL_TABLET | Freq: Every evening | ORAL | 3 refills | Status: AC

## 2024-11-14 MED ORDER — SIMPLY SALINE 0.9 % NA AERS: 2.0000 | INHALATION_SPRAY | NASAL | 11 refills | Status: AC

## 2024-11-14 MED ORDER — DOXYCYCLINE HYCLATE 100 MG PO TABS: 100.0000 mg | ORAL_TABLET | Freq: Two times a day (BID) | ORAL | 0 refills | Status: AC

## 2024-11-14 MED ORDER — PSEUDOEPHEDRINE HCL ER 120 MG PO TB12: 120.0000 mg | ORAL_TABLET | Freq: Two times a day (BID) | ORAL | 0 refills | Status: AC

## 2024-11-14 MED ORDER — FLUTICASONE PROPIONATE 50 MCG/ACT NA SUSP: 2.0000 | Freq: Every day | NASAL | 6 refills | Status: AC

## 2024-11-14 NOTE — Patient Instructions (Addendum)
 It was a pleasure seeing you today! Your health and satisfaction are our top priorities.  Bernardino Cone, MD  VISIT SUMMARY: During your visit, we discussed your persistent cough and chest tightness following flu-like symptoms. You have been experiencing these symptoms for nine days, with a recent history of influenza A infection. We reviewed your current symptoms and provided a treatment plan to address your concerns.  YOUR PLAN: -CHRONIC PANSINUSITIS: Chronic pansinusitis is a prolonged inflammation of the sinuses, often following an infection like the flu. To help with your symptoms, we prescribed Doxycycline  as a backup antibiotic and recommended saline nasal rinses to clear your nasal passages. We also advised using Flonase  Sensimist nasal spray to reduce nasal swelling, Xyzal  for allergy relief, and Sudafed for additional nasal decongestion. Please follow the proper nasal rinse technique we discussed.  -ACUTE BRONCHITIS: Acute bronchitis is an inflammation of the bronchial tubes, often following a viral infection like the flu, leading to a persistent cough and chest tightness. Your lungs are clear, indicating no significant lower respiratory tract infection. We prescribed Doxycycline  to address any bacterial component and advised you to stay hydrated and rest for symptomatic relief.  -INFLUENZA A: Influenza A is a viral infection that caused your initial symptoms of sore throat, headache, fatigue, and fever. While most of these symptoms have resolved, you still have a persistent cough and nasal congestion. Since you did not tolerate Tamiflu, we discussed homeopathic remedies and advised you to stay hydrated, rest, and use saline nasal sprays for symptomatic relief.  INSTRUCTIONS: Please follow up with us  if your symptoms do not improve or if you experience any new or worsening symptoms. Continue with the prescribed medications and recommended treatments. If you have any questions or concerns, do  not hesitate to contact our office.  Your Providers PCP: Job Lukes, PA,  [phone]) Referring Provider: Job Lukes, [place],  [phone])  NEXT STEPS: [x]  Early Intervention: Schedule sooner appointment, call our on-call services, or go to emergency room if there is any significant Increase in pain or discomfort New or worsening symptoms Sudden or severe changes in your health [x]  Flexible Follow-Up: We recommend a No follow-ups on file. for optimal routine care. This allows for progress monitoring and treatment adjustments. [x]  Preventive Care: Schedule your annual preventive care visit! It's typically covered by insurance and helps identify potential health issues early. [x]  Lab & X-ray Appointments: Incomplete tests scheduled today, or call to schedule. X-rays: [facility] (M-F, 8:30am-noon or 1pm-5pm). [x]  Medical Information Release: Sign a release form at front desk to obtain relevant medical information we don't have.  MAKING THE MOST OF OUR FOCUSED 20 MINUTE APPOINTMENTS: [x]   Clearly state your top concerns at the beginning of the visit to focus our discussion [x]   If you anticipate you will need more time, please inform the front desk during scheduling - we can book multiple appointments in the same week. [x]   If you have transportation problems- use our convenient video appointments or ask about transportation support. [x]   We can get down to business faster if you use MyChart to update information before the visit and submit non-urgent questions before your visit. Thank you for taking the time to provide details through MyChart.  Let our nurse know and she can import this information into your encounter documents.  Arrival and Wait Times: [x]   Arriving on time ensures that everyone receives prompt attention. [x]   Early morning (8a) and afternoon (1p) appointments tend to have shortest wait times. [  x]  Unfortunately, we cannot delay appointments for  late arrivals or hold slots during phone calls.  Getting Answers and Following Up [x]   Simple Questions & Concerns: For quick questions or basic follow-up after your visit, reach us  at [phone] or MyChart messaging. [x]   Complex Concerns: If your concern is more complex, scheduling an appointment might be best. Discuss this with the staff to find the most suitable option. [x]   Lab & Imaging Results: We'll contact you directly if results are abnormal or you don't use MyChart. Most normal results will be on MyChart within 2-3 business days, with a review message from Dr. Jesus. Haven't heard back in 2 weeks? Need results sooner? Contact us  at [phone]. [x]   Referrals: Our referral coordinator will manage specialist referrals. The specialist's office should contact you within 2 weeks to schedule an appointment. Call us  if you haven't heard from them after 2 weeks.  Staying Connected [x]   MyChart: Activate your MyChart for the fastest way to access results and message us . See the last page of this paperwork for instructions on how to activate.  Bring to Your Next Appointment [x]   Medications: Please bring all your medication bottles to your next appointment to ensure we have an accurate record of your prescriptions. [x]   Health Diaries: If you're monitoring any health conditions at home, keeping a diary of your readings can be very helpful for discussions at your next appointment.  Billing [x]   X-ray & Lab Orders: These are billed by separate companies. Contact the invoicing company directly for questions or concerns. [x]   Visit Charges: Discuss any billing inquiries with our administrative services team.  Your Satisfaction Matters [x]   Share Your Experience: We strive for your satisfaction! If you have any complaints, or preferably compliments, please let Dr. Jesus know directly or contact our Practice Administrators, Manuelita Rubin or [employer], by asking at the front desk.    Reviewing Your Records [x]   Review this early draft of your clinical encounter notes below and the final encounter summary tomorrow on MyChart after its been completed.  All orders placed so far are visible here: Influenza A -     POCT Influenza A/B  Chronic pansinusitis -     Doxycycline  Hyclate; Take 1 tablet (100 mg total) by mouth 2 (two) times daily.  Dispense: 20 tablet; Refill: 0 -     Fluticasone  Propionate; Place 2 sprays into both nostrils daily.  Dispense: 16 g; Refill: 6 -     Simply Saline; Place 2 each into the nose as directed. Use nightly for sinus hygiene long-term.  Can also be used as many times daily as desired to assist with clearing congested sinuses.  Dispense: 127 mL; Refill: 11 -     Pseudoephedrine  HCl ER; Take 1 tablet (120 mg total) by mouth 2 (two) times daily.  Dispense: 20 tablet; Refill: 0 -     Levocetirizine Dihydrochloride ; Take 1 tablet (5 mg total) by mouth every evening.  Dispense: 90 tablet; Refill: 3  Acute bronchitis with COPD (HCC)         ALLERGY MANAGEMENT PLAN  This plan is designed to help manage your allergic rhinitis (nasal allergies) effectively. Follow these steps daily for best results.  Sinus saline sprays- use nightly, and after sneezing episodes or exposure to allergen.  Insert deeply and spray mist into nose while leaning over sink at 45 degrees,  while gently breathing. Also blow out onto tissue while leaning forward 45 degrees. Once daily,  after a sinus rinse, use sensimist.  Just before bedtime is best. This only needed if allergies acting up.  If this is inadequate add-on once daily for levocetirizine / xyzal  5 mg for nondrowsy antihistamine Take benadryl  25 mg at bedtime also if allergic mucus is persisting  When allergies cause chronic swelling in sinuses, it leads to sinus infections:    DAILY TREATMENT ROUTINE   Time of Day Treatment Steps  Morning 1. Saline Nasal Spray - Use to cleanse nasal passages 2. Xyzal   (levocetirizine) - Take one tablet daily   Throughout Day Saline Nasal Spray - Use 2 additional times (mid-day and afternoon)   Evening/Bedtime 1. Saline Nasal Rinse - Thoroughly clean nasal passages 2. Flonase  Sensimist - Apply after nasal rinse 3. Benadryl  (diphenhydramine ) - Take 25mg  if experiencing persistent congestion    PROPER TECHNIQUE GUIDE       Saline Nasal Spray/Rinse Technique: Lean forward over sink at a 45-degree angle Turn head slightly to one side Insert spray tip into upper nostril Spray gently while breathing lightly through your nose Repeat on other side Gently blow nose to clear excess solution Use saline spray 3 times daily to keep nasal passages moist and clear allergens.       Flonase  Sensimist Technique: Shake bottle gently before each use Prime the bottle if it's new or hasn't been used for a week Tilt your head forward slightly Insert tip into nostril, pointing away from the center of your nose Spray while inhaling gently Repeat in other nostril Use Flonase  Sensimist once daily, preferably at bedtime after using saline rinse. It may take several days of regular use to feel maximum benefit.   WHY FLONASE  SENSIMIST?   Benefits of Flonase  Sensimist:  Alcohol-free and scent-free formula - gentler on sensitive nasal passages Fine mist application - more comfortable with less dripping down throat Effectively relieves nasal congestion, sneezing, runny nose, and even eye symptoms 24-hour relief with once-daily dosing Uses a more potent form of fluticasone  that works at a lower dose Less liquid per spray means less discomfort  UNDERSTANDING YOUR MEDICATIONS   Medication How It Works Important Notes  Flonase  Sensimist (fluticasone  furoate) Reduces inflammation in nasal passages, addressing the underlying cause of allergy symptoms - Takes several days for full effect - Use daily for best results - Safe for long-term use   Xyzal  (levocetirizine) Blocks  histamine to reduce allergy symptoms like sneezing and itching - Take at the same time each day - May cause drowsiness in some people - Once-daily dosing   Benadryl  (diphenhydramine ) Antihistamine that provides additional relief for breakthrough symptoms - Causes drowsiness - Use only at bedtime - For occasional use when needed   Saline Spray/Rinse Physically removes allergens and moistens nasal passages - Safe to use frequently - Improves effectiveness of other treatments - Reduces nasal irritation    CONTACT YOUR PROVIDER IF: Your symptoms do not improve after 1-2 weeks of following this plan You develop sinus pain with fever or green/yellow discharge You experience frequent nosebleeds You develop new or worsening symptoms You have questions about your treatment plan     ADDITIONAL ALLERGY MANAGEMENT TIPS   HELPFUL STRATEGIES: ?? Keep windows closed during high pollen seasons ??? Use allergen-proof covers for pillows and mattresses ?? Vacuum regularly with a HEPA filter vacuum ?? Shower and change clothes after spending time outdoors ?? Check local pollen counts and limit outdoor time when counts are high ?? Stay well-hydrated to help keep mucous membranes moist

## 2024-11-14 NOTE — Telephone Encounter (Signed)
 FYI Only or Action Required?: FYI only for provider: appointment scheduled on 11/14/24.  Patient was last seen in primary care on 09/01/2023 by Jonathan Lukes, PA.  Called Nurse Triage reporting Influenza.  Symptoms began a week ago.  Interventions attempted: OTC medications: Advil, Robitussin DM.  Symptoms are: gradually worsening.  Triage Disposition: See PCP When Office is Open (Within 3 Days)  Patient/caregiver understands and will follow disposition?: Yes    1 week ago sore throat with moderate productive cough with green mucus, fatigue and chest tightness. Last week with positive flu A home test. No SOB. Wife also has the flu. No fever. Mild generalized chest tightness, no pain. Denies cardiac hx. Concerned he may have bronchitis. Scheduled appt with different provider today at home office d/t no PCP availability within timeframe. Advised UC or ED for worsening symptoms.      Copied from CRM #[id number]. Topic: Clinical - Medical Advice >> Nov 14, 2024  8:33 AM Jonathan Elliott wrote: Reason for CRM:   A week ago he tested positive for type A Flu. His cough, congestion (tight in chest) not feeling like chest pains but is lingering. This is not worse but not better. Epic would only let me make an office visit appointment  and first available is 11/19/24. He would like to try to get a chest x-ray or check to see what to do sooner. Please call Jonathan Elliott at [phone] to discuss what can be done earlier. Thanks Reason for Disposition  [1] Nasal discharge AND [2] present > 10 days  Answer Assessment - Initial Assessment Questions 1. WORST SYMPTOM: What is your worst symptom? (e.g., cough, runny nose, muscle aches, headache, sore throat, fever)      Cough and fatigue and chest tightness  2. ONSET: When did your flu symptoms start?      1 week ago  3. COUGH: How bad is the cough?       Moderate green mucus  4. RESPIRATORY DISTRESS: Describe your breathing.      No SOB  5.  FEVER: Do you have a fever? If Yes, ask: What is your temperature, how was it measured, and when did it start?     Denies  6. EXPOSURE: Were you exposed to someone with influenza?       Wife had the flu  7. FLU VACCINE: Did you get a flu shot this year?     Denies  8. HIGH RISK DISEASE: Do you have any chronic medical problems? (e.g., heart or lung disease, asthma, weak immune system, or other HIGH RISK conditions)     Denies  10. OTHER SYMPTOMS: Do you have any other symptoms?  (e.g., runny nose, muscle aches, headache, sore throat)       Denies  Protocols used: Influenza (Flu) - [facility]

## 2024-11-14 NOTE — Telephone Encounter (Signed)
 Noted

## 2024-11-14 NOTE — Progress Notes (Signed)
 ==============================  [facility] [facility]: [phone]   -- Medical Office Visit --  Patient: Jonathan Elliott      Age: 49 y.o.       Sex:  male  Date:   11/14/2024 Today's Healthcare Provider: Bernardino KANDICE Cone, MD  ==============================   Chief Complaint: Influenza (Expressed caught it from his wife last week. On the latter end but he has chest tightness. )  Discussed the use of AI scribe software for clinical note transcription with the patient, who gave verbal consent to proceed.  History of Present Illness  49 year old male who presents with persistent cough and chest tightness following flu-like symptoms.  He began experiencing flu-like symptoms nine days ago, including sore throat, headache, fatigue, and fever, with the fever peaking at 101F. The fever resolved approximately five or six days ago. Currently, he has a persistent cough and chest tightness, along with nasal congestion and post-nasal drip.  His wife tested positive for flu A, and he developed similar symptoms shortly after. He attempted to take Tamiflu, but it worsened his symptoms, leading to discontinuation. His wife also stopped taking Tamiflu due to stomach upset.  He describes the cough as annoying and is concerned about the possibility of developing bronchitis. He has been using a nasal spray, possibly Afrin, but is unsure of the exact name. He has been using nasal saline mist to alleviate his symptoms. No recent sore throat or fever, but the cough and nasal congestion persist.  No current fever or sore throat. He has experienced fatigue and headaches in the past week.  Background Reviewed: Problem List: has Family history of heart disease; Dizziness; Ganglion cyst of flexor tendon sheath; and Panic attacks on their problem list. Past Medical History:  has a past medical history of Allergy and COVID-19 (07/09/2020). Past Surgical History:   has a past surgical  history that includes Appendectomy (2002). Social History:   reports that he has never smoked. He has never used smokeless tobacco. He reports that he does not drink alcohol and does not use drugs. Family History:  family history includes Heart attack in his paternal grandmother; Heart disease (age of onset: 31) in his father. Allergies:  is allergic to penicillins.   Medication Reconciliation: Current Outpatient Medications on File Prior to Visit  Medication Sig   Ibuprofen (ADVIL) 200 MG CAPS Take 400 mg by mouth as needed.   No current facility-administered medications on file prior to visit.  There are no discontinued medications.   Physical Exam:    11/14/2024    3:21 PM 01/17/2024    1:42 PM 01/06/2024   10:45 AM  Vitals with BMI  Height 5' 11 5' 11 5' 11  Weight 168 lbs 3 oz 171 lbs 167 lbs  BMI 23.47 23.86 23.3  Systolic 110 120 867  Diastolic 82 82 86  Pulse 76 77 82  Vital signs reviewed.  Nursing notes reviewed. Weight trend reviewed. Physical Activity: Sufficiently Active (11/14/2024)   Exercise Vital Sign    Days of Exercise per Week: 6 days    Minutes of Exercise per Session: 90 min   General Appearance:  No acute distress appreciable.   Well-groomed, healthy-appearing male.  Well proportioned with no abnormal fat distribution.  Good muscle tone. Pulmonary:  Normal work of breathing at rest, no respiratory distress apparent. SpO2: 99 %  Musculoskeletal: All extremities are intact.  Neurological:  Awake, alert, oriented, and engaged.  No obvious focal neurological deficits  or cognitive impairments.  Sensorium seems unclouded.   Speech is clear and coherent with logical content. Psychiatric:  Appropriate mood, pleasant and cooperative demeanor, thoughtful and engaged during the exam  Verbalized to patient: Physical Exam CHEST: Clear to auscultation bilaterally. No wheezes, rhonchi, or crackles.     11/14/2024    3:20 PM 09/01/2023   10:30 AM 07/08/2023    8:00 AM  12/08/2021    8:01 AM  PHQ 2/9 Scores  PHQ - 2 Score 0 0 0 0  PHQ- 9 Score  0  0       Data saved with a previous flowsheet row definition   Office Visit on 11/14/2024  Component Date Value Ref Range Status   Influenza A, POC 11/14/2024 Negative  Negative Final   Influenza B, POC 11/14/2024 Negative  Negative Final  Office Visit on 01/06/2024  Component Date Value Ref Range Status   Sed Rate 01/06/2024 3  0 - 15 mm/hr Final   Rheumatoid fact SerPl-aCnc 01/06/2024 <10  <14 IU/mL Final   HLA-B27 Antigen 01/06/2024 NEGATIVE  NEGATIVE Final   Cyclic Citrullin Peptide Ab 97/71/7974 <16  UNITS Final   Anti Nuclear Antibody (ANA) 01/06/2024 POSITIVE (A)  NEGATIVE Final   ANA Titer 1 01/06/2024 1:80 (H)  titer Final   ANA Pattern 1 01/06/2024 Nuclear, Speckled (A)   Final  Appointment on 10/18/2023  Component Date Value Ref Range Status   Area-P 1/2 10/18/2023 2.30  cm2 Final   S' Lateral 10/18/2023 2.90  cm Final   Est EF 10/18/2023 55 - 60%   Final  Office Visit on 09/01/2023  Component Date Value Ref Range Status   Color, UA 09/01/2023 yellow   Final   Clarity, UA 09/01/2023 clear   Final   Glucose, UA 09/01/2023 Negative  Negative Final   Bilirubin, UA 09/01/2023 Negative   Final   Ketones, UA 09/01/2023 Negative   Final   Spec Grav, UA 09/01/2023 1.010  1.010 - 1.025 Final   Blood, UA 09/01/2023 Negative   Final   pH, UA 09/01/2023 7.0  5.0 - 8.0 Final   Protein, UA 09/01/2023 Negative  Negative Final   Urobilinogen, UA 09/01/2023 0.2  0.2 or 1.0 E.U./dL Final   Nitrite, UA 89/75/7975 Negative   Final   Leukocytes, UA 09/01/2023 Negative  Negative Final  Office Visit on 08/29/2023  Component Date Value Ref Range Status   Sodium 08/29/2023 141  135 - 145 mEq/L Final   Potassium 08/29/2023 4.4  3.5 - 5.1 mEq/L Final   Chloride 08/29/2023 105  96 - 112 mEq/L Final   CO2 08/29/2023 28  19 - 32 mEq/L Final   Glucose, Bld 08/29/2023 96  70 - 99 mg/dL Final   BUN 89/78/7975 23  6  - 23 mg/dL Final   Creatinine, Ser 08/29/2023 1.12  0.40 - 1.50 mg/dL Final   Total Bilirubin 08/29/2023 0.6  0.2 - 1.2 mg/dL Final   Alkaline Phosphatase 08/29/2023 46  39 - 117 U/L Final   AST 08/29/2023 23  0 - 37 U/L Final   ALT 08/29/2023 21  0 - 53 U/L Final   Total Protein 08/29/2023 7.1  6.0 - 8.3 g/dL Final   Albumin 89/78/7975 4.7  3.5 - 5.2 g/dL Final   GFR 89/78/7975 77.51  >60.00 mL/min Final   Calcium 08/29/2023 9.6  8.4 - 10.5 mg/dL Final   WBC 89/78/7975 6.9  4.0 - 10.5 K/uL Final   RBC 08/29/2023 5.32  4.22 -  5.81 Mil/uL Final   Hemoglobin 08/29/2023 15.9  13.0 - 17.0 g/dL Final   HCT 89/78/7975 48.9  39.0 - 52.0 % Final   MCV 08/29/2023 92.0  78.0 - 100.0 fl Final   MCHC 08/29/2023 32.4  30.0 - 36.0 g/dL Final   RDW 89/78/7975 13.3  11.5 - 15.5 % Final   Platelets 08/29/2023 242.0  150.0 - 400.0 K/uL Final   Neutrophils Relative % 08/29/2023 72.0  43.0 - 77.0 % Final   Lymphocytes Relative 08/29/2023 17.7  12.0 - 46.0 % Final   Monocytes Relative 08/29/2023 8.6  3.0 - 12.0 % Final   Eosinophils Relative 08/29/2023 1.0  0.0 - 5.0 % Final   Basophils Relative 08/29/2023 0.7  0.0 - 3.0 % Final   Neutro Abs 08/29/2023 5.0  1.4 - 7.7 K/uL Final   Lymphs Abs 08/29/2023 1.2  0.7 - 4.0 K/uL Final   Monocytes Absolute 08/29/2023 0.6  0.1 - 1.0 K/uL Final   Eosinophils Absolute 08/29/2023 0.1  0.0 - 0.7 K/uL Final   Basophils Absolute 08/29/2023 0.0  0.0 - 0.1 K/uL Final   TSH 08/29/2023 1.010  0.450 - 4.500 uIU/mL Final   Vitamin B-12 08/29/2023 545  211 - 911 pg/mL Final  No image results found. No results found.       ASSESSMENT & PLAN   Assessment & Plan Influenza A Recent influenza A infection presented with sore throat, headache, fatigue, and fever. Symptoms have largely resolved except for persistent cough and nasal congestion. Initial treatment with Tamiflu was not tolerated due to adverse effects. There is a lack of evidence-based treatment for post-viral  recovery, but homeopathic remedies were discussed. Symptomatic relief measures, including hydration, rest, and saline nasal sprays, are advised. Chronic pansinusitis Prolonged sinus symptoms and nasal congestion following influenza A infection suggest chronic pansinusitis, with sinus headache, drainage, and post-nasal drip. A secondary bacterial infection is possible due to prolonged symptoms. Doxycycline  is prescribed as a backup antibiotic, with a warning about esophageal ulceration if not taken with adequate fluid. Saline nasal rinses are recommended to clear nasal passages and reduce viral particles. Flonase  Sensimist nasal spray is advised to reduce nasal swelling. Xyzal  is suggested for allergy relief and to reduce sinus drainage. Sudafed is recommended for additional nasal decongestion, with caution regarding potential cardiac effects. Proper nasal rinse technique was instructed to maximize effectiveness. Acute bronchitis with COPD (HCC) Acute bronchitis is likely secondary to a post-viral cough following influenza A infection, presenting with persistent cough and chest tightness. Lungs are clear, indicating no significant lower respiratory tract infection. Doxycycline  is prescribed to address any bacterial component. Symptomatic relief measures, including hydration and rest, are advised.  ORDER ASSOCIATIONS  #   DIAGNOSIS / CONDITION ICD-10 ENCOUNTER ORDER     ICD-10-CM   1. Influenza A  J10.1 POCT Influenza A/B    2. Chronic pansinusitis  J32.4 doxycycline  (VIBRA -TABS) 100 MG tablet    fluticasone  (FLONASE ) 50 MCG/ACT nasal spray    Saline (SIMPLY SALINE) 0.9 % AERS    pseudoephedrine  (SUDAFED 12 HOUR) 120 MG 12 hr tablet    levocetirizine (XYZAL ) 5 MG tablet    3. Acute bronchitis with COPD (HCC)  J44.0    J20.9          Orders Placed in Encounter:   Lab Orders         POCT Influenza A/B     Meds ordered this encounter  Medications   doxycycline  (VIBRA -TABS) 100 MG tablet  Sig: Take 1 tablet (100 mg total) by mouth 2 (two) times daily.    Dispense:  20 tablet    Refill:  0   fluticasone  (FLONASE ) 50 MCG/ACT nasal spray    Sig: Place 2 sprays into both nostrils daily.    Dispense:  16 g    Refill:  6   Saline (SIMPLY SALINE) 0.9 % AERS    Sig: Place 2 each into the nose as directed. Use nightly for sinus hygiene long-term.  Can also be used as many times daily as desired to assist with clearing congested sinuses.    Dispense:  127 mL    Refill:  11   pseudoephedrine  (SUDAFED 12 HOUR) 120 MG 12 hr tablet    Sig: Take 1 tablet (120 mg total) by mouth 2 (two) times daily.    Dispense:  20 tablet    Refill:  0   levocetirizine (XYZAL ) 5 MG tablet    Sig: Take 1 tablet (5 mg total) by mouth every evening.    Dispense:  90 tablet    Refill:  3    Orders Placed This Encounter  Procedures   POCT Influenza A/B      This document was synthesized by artificial intelligence ([employer]bridge) using HIPAA-compliant recording of the clinical interaction;   We discussed the use of AI scribe software for clinical note transcription with the patient, who gave verbal consent to proceed. additional Info: This encounter employed state-of-the-art, real-time, collaborative documentation. The patient actively reviewed and assisted in updating their electronic medical record on a shared screen, ensuring transparency and facilitating joint problem-solving for the problem list, overview, and plan. This approach promotes accurate, informed care. The treatment plan was discussed and reviewed in detail, including medication safety, potential side effects, and all patient questions. We confirmed understanding and comfort with the plan. Follow-up instructions were established, including contacting the office for any concerns, returning if symptoms worsen, persist, or new symptoms develop, and precautions for potential emergency department visits.

## 2024-11-19 ENCOUNTER — Ambulatory Visit: Admitting: Physician Assistant
